# Patient Record
Sex: Female | Born: 1985 | Race: Black or African American | Hispanic: No | Marital: Single | Smoking: Former smoker
Health system: Southern US, Community
[De-identification: ages and names within clinical notes are randomized; demographics above are authoritative.]

## PROBLEM LIST (undated history)

## (undated) DIAGNOSIS — J40 Bronchitis, not specified as acute or chronic: Secondary | ICD-10-CM

## (undated) DIAGNOSIS — I10 Essential (primary) hypertension: Secondary | ICD-10-CM

## (undated) DIAGNOSIS — D649 Anemia, unspecified: Secondary | ICD-10-CM

## (undated) DIAGNOSIS — B20 Human immunodeficiency virus [HIV] disease: Secondary | ICD-10-CM

## (undated) DIAGNOSIS — E669 Obesity, unspecified: Secondary | ICD-10-CM

## (undated) HISTORY — PX: NO PAST SURGERIES: SHX2092

## (undated) HISTORY — DX: Essential (primary) hypertension: I10

---
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
## 2006-11-04 ENCOUNTER — Emergency Department (HOSPITAL_COMMUNITY): Admission: EM | Admit: 2006-11-04 | Discharge: 2006-11-04 | Payer: Self-pay | Admitting: Emergency Medicine

## 2006-12-09 ENCOUNTER — Emergency Department (HOSPITAL_COMMUNITY): Admission: EM | Admit: 2006-12-09 | Discharge: 2006-12-09 | Payer: Self-pay | Admitting: Emergency Medicine

## 2008-02-21 ENCOUNTER — Ambulatory Visit: Payer: Self-pay | Admitting: Gynecology

## 2008-02-21 ENCOUNTER — Inpatient Hospital Stay (HOSPITAL_COMMUNITY): Admission: AD | Admit: 2008-02-21 | Discharge: 2008-02-21 | Payer: Self-pay | Admitting: Gynecology

## 2008-02-22 ENCOUNTER — Ambulatory Visit: Payer: Self-pay | Admitting: Obstetrics and Gynecology

## 2008-02-22 ENCOUNTER — Inpatient Hospital Stay (HOSPITAL_COMMUNITY): Admission: AD | Admit: 2008-02-22 | Discharge: 2008-02-24 | Payer: Self-pay | Admitting: Obstetrics & Gynecology

## 2008-07-24 ENCOUNTER — Emergency Department (HOSPITAL_COMMUNITY): Admission: EM | Admit: 2008-07-24 | Discharge: 2008-07-24 | Payer: Self-pay | Admitting: Emergency Medicine

## 2008-07-26 ENCOUNTER — Emergency Department (HOSPITAL_COMMUNITY): Admission: EM | Admit: 2008-07-26 | Discharge: 2008-07-26 | Payer: Self-pay | Admitting: Emergency Medicine

## 2008-08-01 ENCOUNTER — Emergency Department (HOSPITAL_COMMUNITY): Admission: EM | Admit: 2008-08-01 | Discharge: 2008-08-01 | Payer: Self-pay | Admitting: Emergency Medicine

## 2008-10-23 ENCOUNTER — Emergency Department (HOSPITAL_COMMUNITY): Admission: EM | Admit: 2008-10-23 | Discharge: 2008-10-24 | Payer: Self-pay | Admitting: Emergency Medicine

## 2008-10-24 ENCOUNTER — Emergency Department (HOSPITAL_COMMUNITY): Admission: EM | Admit: 2008-10-24 | Discharge: 2008-10-24 | Payer: Self-pay | Admitting: Emergency Medicine

## 2010-01-10 ENCOUNTER — Emergency Department (HOSPITAL_COMMUNITY): Admission: EM | Admit: 2010-01-10 | Discharge: 2010-01-10 | Payer: Self-pay | Admitting: Emergency Medicine

## 2010-11-02 ENCOUNTER — Emergency Department (HOSPITAL_COMMUNITY)
Admission: EM | Admit: 2010-11-02 | Discharge: 2010-11-02 | Payer: Self-pay | Source: Home / Self Care | Admitting: Emergency Medicine

## 2010-12-14 ENCOUNTER — Emergency Department (HOSPITAL_COMMUNITY)
Admission: EM | Admit: 2010-12-14 | Discharge: 2010-12-14 | Payer: Self-pay | Source: Home / Self Care | Admitting: Emergency Medicine

## 2010-12-14 LAB — URINALYSIS, ROUTINE W REFLEX MICROSCOPIC
Bilirubin Urine: NEGATIVE
Ketones, ur: NEGATIVE mg/dL
Nitrite: NEGATIVE
Urobilinogen, UA: 0.2 mg/dL (ref 0.0–1.0)
pH: 6.5 (ref 5.0–8.0)

## 2010-12-14 LAB — POCT PREGNANCY, URINE: Preg Test, Ur: NEGATIVE

## 2010-12-14 LAB — WET PREP, GENITAL: Yeast Wet Prep HPF POC: NONE SEEN

## 2010-12-14 LAB — URINE MICROSCOPIC-ADD ON

## 2010-12-15 LAB — POCT PREGNANCY, URINE: Preg Test, Ur: NEGATIVE

## 2011-01-03 ENCOUNTER — Emergency Department (HOSPITAL_COMMUNITY)
Admission: EM | Admit: 2011-01-03 | Discharge: 2011-01-03 | Disposition: A | Discharge status: Home / Self Care | Payer: Medicaid Other | Attending: Emergency Medicine | Admitting: Emergency Medicine

## 2011-01-03 ENCOUNTER — Ambulatory Visit (HOSPITAL_COMMUNITY)
Admission: RE | Admit: 2011-01-03 | Discharge: 2011-01-03 | Disposition: A | Discharge status: Home / Self Care | Payer: Medicaid Other | Source: Ambulatory Visit | Attending: Emergency Medicine | Admitting: Emergency Medicine

## 2011-01-03 DIAGNOSIS — R300 Dysuria: Secondary | ICD-10-CM | POA: Insufficient documentation

## 2011-01-03 DIAGNOSIS — Z79899 Other long term (current) drug therapy: Secondary | ICD-10-CM | POA: Insufficient documentation

## 2011-01-03 DIAGNOSIS — Z21 Asymptomatic human immunodeficiency virus [HIV] infection status: Secondary | ICD-10-CM | POA: Insufficient documentation

## 2011-01-03 DIAGNOSIS — IMO0001 Reserved for inherently not codable concepts without codable children: Secondary | ICD-10-CM | POA: Insufficient documentation

## 2011-01-03 DIAGNOSIS — R05 Cough: Secondary | ICD-10-CM | POA: Insufficient documentation

## 2011-01-03 DIAGNOSIS — R509 Fever, unspecified: Secondary | ICD-10-CM | POA: Insufficient documentation

## 2011-01-03 DIAGNOSIS — J029 Acute pharyngitis, unspecified: Secondary | ICD-10-CM | POA: Insufficient documentation

## 2011-01-03 DIAGNOSIS — R070 Pain in throat: Secondary | ICD-10-CM | POA: Insufficient documentation

## 2011-01-03 DIAGNOSIS — F3130 Bipolar disorder, current episode depressed, mild or moderate severity, unspecified: Secondary | ICD-10-CM | POA: Insufficient documentation

## 2011-01-03 DIAGNOSIS — J40 Bronchitis, not specified as acute or chronic: Secondary | ICD-10-CM | POA: Insufficient documentation

## 2011-01-03 DIAGNOSIS — R059 Cough, unspecified: Secondary | ICD-10-CM | POA: Insufficient documentation

## 2011-01-03 LAB — DIFFERENTIAL
Basophils Relative: 0 % (ref 0–1)
Lymphocytes Relative: 13 % (ref 12–46)
Monocytes Absolute: 1 10*3/uL (ref 0.1–1.0)
Monocytes Relative: 7 % (ref 3–12)
Neutro Abs: 10.3 10*3/uL — ABNORMAL HIGH (ref 1.7–7.7)
Neutrophils Relative %: 78 % — ABNORMAL HIGH (ref 43–77)

## 2011-01-03 LAB — LACTIC ACID, PLASMA: Lactic Acid, Venous: 1.5 mmol/L (ref 0.5–2.2)

## 2011-01-03 LAB — CBC
HCT: 31.6 % — ABNORMAL LOW (ref 36.0–46.0)
Hemoglobin: 10.3 g/dL — ABNORMAL LOW (ref 12.0–15.0)
MCH: 25.2 pg — ABNORMAL LOW (ref 26.0–34.0)
RBC: 4.08 MIL/uL (ref 3.87–5.11)

## 2011-01-03 LAB — COMPREHENSIVE METABOLIC PANEL
ALT: 28 U/L (ref 0–35)
AST: 29 U/L (ref 0–37)
CO2: 24 mEq/L (ref 19–32)
Chloride: 106 mEq/L (ref 96–112)
Creatinine, Ser: 0.82 mg/dL (ref 0.4–1.2)
GFR calc Af Amer: >60 mL/min (ref >60)
GFR calc non Af Amer: >60 mL/min (ref >60)
Glucose, Bld: 110 mg/dL — ABNORMAL HIGH (ref 70–99)
Sodium: 138 mEq/L (ref 135–145)
Total Bilirubin: 0.3 mg/dL (ref 0.3–1.2)

## 2011-01-03 LAB — URINALYSIS, ROUTINE W REFLEX MICROSCOPIC
Bilirubin Urine: NEGATIVE
Hgb urine dipstick: NEGATIVE
Nitrite: NEGATIVE
Specific Gravity, Urine: 1.025 (ref 1.005–1.030)
Urine Glucose, Fasting: NEGATIVE mg/dL
pH: 7 (ref 5.0–8.0)

## 2011-01-03 LAB — PROCALCITONIN: Procalcitonin: <0.1 ng/mL

## 2011-01-05 LAB — URINE CULTURE

## 2011-01-08 LAB — CULTURE, BLOOD (ROUTINE X 2): Culture: NO GROWTH

## 2011-04-03 NOTE — Procedures (Signed)
Theresa Becker, Theresa Becker                ACCOUNT NO.:  [id number]   MEDICAL RECORD NO.:  [id number]          PATIENT TYPE:  EMS   LOCATION:  ED                            FACILITY:  [facility]   PHYSICIAN:  Edward L. Juanetta Gosling, M.D.DATE OF BIRTH:  04/20/1986   DATE OF PROCEDURE:  12/20/2005  DATE OF DISCHARGE:  12/20/2005                                EKG INTERPRETATION   The rhythm appears to be a ventricular bigeminy with a rate of about 70.  Abnormal electrocardiogram.      Oneal Deputy. Juanetta Gosling, M.D.  Electronically Signed     [facility]/MEDQ  D:  12/25/2005  T:  12/25/2005  Job:  [id number]

## 2011-08-11 LAB — CBC
HCT: 33.2 — ABNORMAL LOW
Hemoglobin: 11.3 — ABNORMAL LOW
MCHC: 33.9
MCV: 79.5
Platelets: 147 — ABNORMAL LOW
Platelets: 165
RBC: 4.18
RDW: 16.4 — ABNORMAL HIGH
RDW: 17.1 — ABNORMAL HIGH

## 2011-08-11 LAB — URIC ACID: Uric Acid, Serum: 3.4

## 2011-08-11 LAB — COMPREHENSIVE METABOLIC PANEL
ALT: 21
Albumin: 2.6 — ABNORMAL LOW
BUN: 8
CO2: 24
Calcium: 9.2
Creatinine, Ser: 0.47
GFR calc non Af Amer: >60
Glucose, Bld: 82

## 2011-08-11 LAB — LACTATE DEHYDROGENASE: LDH: 176

## 2011-08-11 LAB — URINALYSIS, DIPSTICK ONLY
Ketones, ur: NEGATIVE
Leukocytes, UA: NEGATIVE
Nitrite: NEGATIVE
Protein, ur: NEGATIVE
Urobilinogen, UA: 0.2

## 2011-08-19 LAB — CBC
HCT: 36.6
MCHC: 33.1
MCV: 78.4
Platelets: 154
WBC: 8

## 2011-08-19 LAB — COMPREHENSIVE METABOLIC PANEL
Albumin: 3.4 — ABNORMAL LOW
BUN: 17
CO2: 26
Chloride: 106
Creatinine, Ser: 0.93

## 2011-08-19 LAB — DIFFERENTIAL
Basophils Relative: 0
Eosinophils Absolute: 0.1
Eosinophils Relative: 2
Lymphs Abs: 1.7
Monocytes Absolute: 0.7
Monocytes Relative: 9
Neutrophils Relative %: 69

## 2011-08-19 LAB — URINALYSIS, ROUTINE W REFLEX MICROSCOPIC
Nitrite: NEGATIVE
Protein, ur: NEGATIVE
Urobilinogen, UA: 0.2

## 2011-08-19 LAB — PREGNANCY, URINE: Preg Test, Ur: NEGATIVE

## 2011-08-19 LAB — URINE MICROSCOPIC-ADD ON

## 2012-01-02 ENCOUNTER — Emergency Department (HOSPITAL_COMMUNITY): Payer: Medicaid Other

## 2012-01-02 ENCOUNTER — Encounter (HOSPITAL_COMMUNITY): Payer: Self-pay | Admitting: *Deleted

## 2012-01-02 ENCOUNTER — Emergency Department (HOSPITAL_COMMUNITY)
Admission: EM | Admit: 2012-01-02 | Discharge: 2012-01-02 | Disposition: A | Discharge status: Home / Self Care | Payer: Medicaid Other | Attending: Emergency Medicine | Admitting: Emergency Medicine

## 2012-01-02 DIAGNOSIS — R079 Chest pain, unspecified: Secondary | ICD-10-CM | POA: Insufficient documentation

## 2012-01-02 DIAGNOSIS — M549 Dorsalgia, unspecified: Secondary | ICD-10-CM | POA: Insufficient documentation

## 2012-01-02 DIAGNOSIS — R0789 Other chest pain: Secondary | ICD-10-CM

## 2012-01-02 MED ORDER — NAPROXEN 500 MG PO TABS: 500.0000 mg | ORAL_TABLET | Freq: Two times a day (BID) | ORAL | Status: DC

## 2012-01-02 NOTE — ED Notes (Signed)
Pt a/ox4. Resp even and unlabored. NAD at this time. D/C instructions reviewed with pt. Pt verbalized understanding. Pt ambulated to lobby with steady gate.  

## 2012-01-02 NOTE — Discharge Instructions (Signed)
No sniffing and amount is found on chest x-ray. Continue to followup with your chiropractor for the upper back problems. Return for new or worse symptoms. Trial of anti-inflammatory medicine for 7 days to see if it helps.

## 2012-01-02 NOTE — ED Provider Notes (Signed)
History   This chart was scribed for Shelda Jakes, MD by Charolett Bumpers . The patient was seen in room APFT23/APFT23 and the patient's care was started at 7:09pm.   CSN: [id number]  Arrival date & time 01/02/12  Rickey Primus   First MD Initiated Contact with Patient 01/02/12 1849      Chief Complaint  Patient presents with  . Back Pain    (Consider location/radiation/quality/duration/timing/severity/associated sxs/prior treatment) HPI Peace Noyes Teagarden is a 26 y.o. female who presents to the Emergency Department complaining of constant, moderate upper back pain in the mid thoracic area with associated swelling on her breast bone. Patient describes the breast bone swelling as a "knot". Patient states that the upper back pain has been constant for the past month. Patient states that "knot" on her breast bone has been there for the past 3 days. Patient notes that she has been experiencing back pain for the past year, and has been seen for the back pain. Per patient's report, she states that she was told she was suffering from muscle spasms. No other symptoms reported. No pertinent medical hx reported.    History reviewed. No pertinent past medical history.  History reviewed. No pertinent past surgical history.  History reviewed. No pertinent family history.  History  Substance Use Topics  . Smoking status: Former Games developer  . Smokeless tobacco: Not on file  . Alcohol Use: No    OB History    Grav Para Term Preterm Abortions TAB SAB Ect Mult Living                  Review of Systems A complete 10 system review of systems was obtained and is otherwise negative except as noted in the HPI and PMH.   Allergies  Penicillins  Home Medications   Current Outpatient Rx  Name Route Sig Dispense Refill  . NAPROXEN 500 MG PO TABS Oral Take 1 tablet (500 mg total) by mouth 2 (two) times daily. 14 tablet 0    BP 153/87  Pulse 96  Temp(Src) 98.6 F (37 C) (Oral)  Resp 18  Ht 5'  2" (1.575 m)  Wt 273 lb (123.832 kg)  BMI 49.93 kg/m2  SpO2 100%  LMP 12/18/2011  Physical Exam  Nursing note and vitals reviewed. Constitutional: She is oriented to person, place, and time. She appears well-developed and well-nourished. No distress.  HENT:  Head: Normocephalic and atraumatic.  Eyes: EOM are normal. Pupils are equal, round, and reactive to light.  Neck: Normal range of motion. Neck supple. No tracheal deviation present.  Cardiovascular: Normal rate, regular rhythm and normal heart sounds.  Exam reveals no gallop and no friction rub.   No murmur heard. Pulmonary/Chest: Effort normal and breath sounds normal. No respiratory distress. She has no wheezes. She has no rales. She exhibits no tenderness.  Abdominal: Soft. Bowel sounds are normal. She exhibits no distension. There is no tenderness.  Musculoskeletal: Normal range of motion. She exhibits no edema.       No midline cervical, thoracic or lumbar tenderness.   Neurological: She is alert and oriented to person, place, and time. No cranial nerve deficit or sensory deficit. Coordination normal.  Skin: Skin is warm and dry.  Psychiatric: She has a normal mood and affect. Her behavior is normal.    ED Course  Procedures (including critical care time)  DIAGNOSTIC STUDIES: Oxygen Saturation is 100% on room air, normal by my interpretation.    COORDINATION OF CARE:  Labs Reviewed - No data to display Dg Chest 2 View  01/02/2012  *RADIOLOGY REPORT*  Clinical Data: Right-sided chest pain for 3 days.  Remote fall.  CHEST - 2 VIEW  Comparison: 01/03/2011.  Findings:  No obvious thoracic spine fracture.  If this is of concern, additional imaging may be considered.  No infiltrate, congestive heart failure or pneumothorax.  Heart size within normal limits.  IMPRESSION: No acute abnormality.  Please see above.  Original Report Authenticated By: Fuller Canada, M.D.     1. Back pain   2. Sternal pain       MDM    Chest x-ray negative for a specific findings. Clinically suspicious just mild tenderness at the distal end of the sternum. A skin cyst present no erythema no palpable abnormality.   I personally performed the services described in this documentation, which was scribed in my presence. The recorded information has been reviewed and considered.        Shelda Jakes, MD 01/02/12 [phone]

## 2012-01-02 NOTE — ED Notes (Signed)
Pt states that she is having upper back pain but feels like she has a knot on her breast bone.

## 2012-01-10 ENCOUNTER — Emergency Department (HOSPITAL_COMMUNITY)
Admission: EM | Admit: 2012-01-10 | Discharge: 2012-01-10 | Disposition: A | Discharge status: Home / Self Care | Payer: Medicaid Other | Attending: Emergency Medicine | Admitting: Emergency Medicine

## 2012-01-10 ENCOUNTER — Encounter (HOSPITAL_COMMUNITY): Payer: Self-pay

## 2012-01-10 DIAGNOSIS — J019 Acute sinusitis, unspecified: Secondary | ICD-10-CM | POA: Insufficient documentation

## 2012-01-10 DIAGNOSIS — R05 Cough: Secondary | ICD-10-CM | POA: Insufficient documentation

## 2012-01-10 DIAGNOSIS — R059 Cough, unspecified: Secondary | ICD-10-CM | POA: Insufficient documentation

## 2012-01-10 HISTORY — DX: Human immunodeficiency virus (HIV) disease: B20

## 2012-01-10 MED ORDER — AZITHROMYCIN 250 MG PO TABS: 250.0000 mg | ORAL_TABLET | Freq: Every day | ORAL | Status: AC

## 2012-01-10 NOTE — Discharge Instructions (Signed)

## 2012-01-10 NOTE — ED Provider Notes (Signed)
History     CSN: [id number]  Arrival date & time 01/10/12  0902   First MD Initiated Contact with Patient 01/10/12 [phone]      Chief Complaint  Patient presents with  . URI    (Consider location/radiation/quality/duration/timing/severity/associated sxs/prior treatment) Patient is a 26 y.o. female presenting with URI. The history is provided by the patient.  URI The primary symptoms include cough. Primary symptoms do not include fever, headaches, sore throat, swollen glands, abdominal pain, nausea, arthralgias or rash. Primary symptoms comment: nasal congestion with purulent nasal discharge, blood-tinged.  Low-grade fever. The current episode started 3 to 5 days ago. This is a new problem. The problem has not changed since onset. The cough began 2 days ago. The cough is new. The cough is non-productive.  Symptoms associated with the illness include sinus pressure, congestion and rhinorrhea.    Past Medical History  Diagnosis Date  . HIV disease     History reviewed. No pertinent past surgical history.  No family history on file.  History  Substance Use Topics  . Smoking status: Former Games developer  . Smokeless tobacco: Not on file  . Alcohol Use: No    OB History    Grav Para Term Preterm Abortions TAB SAB Ect Mult Living                  Review of Systems  Constitutional: Negative for fever.  HENT: Positive for congestion, rhinorrhea and sinus pressure. Negative for sore throat and neck pain.   Eyes: Negative.   Respiratory: Positive for cough. Negative for chest tightness and shortness of breath.   Cardiovascular: Negative for chest pain.  Gastrointestinal: Negative for nausea and abdominal pain.  Genitourinary: Negative.   Musculoskeletal: Negative for joint swelling and arthralgias.  Skin: Negative.  Negative for rash and wound.  Neurological: Negative for dizziness, weakness, light-headedness, numbness and headaches.  Hematological: Negative.     Psychiatric/Behavioral: Negative.     Allergies  Penicillins  Home Medications   Current Outpatient Rx  Name Route Sig Dispense Refill  . NAPROXEN 500 MG PO TABS Oral Take 1 tablet (500 mg total) by mouth 2 (two) times daily. 14 tablet 0    BP 128/96  Pulse 93  Temp(Src) 97.9 F (36.6 C) (Oral)  Resp 18  SpO2 100%  LMP 01/07/2012  Physical Exam  Nursing note and vitals reviewed. Constitutional: She is oriented to person, place, and time. She appears well-developed and well-nourished.  HENT:  Head: Normocephalic and atraumatic.  Right Ear: Tympanic membrane and ear canal normal.  Left Ear: Tympanic membrane and ear canal normal.  Nose: Mucosal edema and rhinorrhea present. Right sinus exhibits frontal sinus tenderness. Left sinus exhibits frontal sinus tenderness.  Eyes: Conjunctivae are normal.  Neck: Normal range of motion.  Cardiovascular: Normal rate, regular rhythm, normal heart sounds and intact distal pulses.   Pulmonary/Chest: Effort normal and breath sounds normal. No respiratory distress. She has no wheezes. She has no rales.  Abdominal: Soft. Bowel sounds are normal. There is no tenderness.  Musculoskeletal: Normal range of motion.  Neurological: She is alert and oriented to person, place, and time.  Skin: Skin is warm and dry.  Psychiatric: She has a normal mood and affect.    ED Course  Procedures (including critical care time)  Labs Reviewed - No data to display No results found.   1. Sinusitis acute       MDM  Zithromax.  Patient to continue her Mucinex.  Fluids,  recheck by PCP this week if not improved.   Patient states his last CD4 count was 600, viral load less than 40, was checked less than one month ago.     Candis Musa, PA 01/10/12 1011  Candis Musa, PA 01/10/12 1013

## 2012-01-10 NOTE — ED Provider Notes (Signed)
Medical screening examination/treatment/procedure(s) were performed by non-physician practitioner and as supervising physician I was immediately available for consultation/collaboration.   Geoffery Lyons, MD 01/10/12 1113

## 2012-01-10 NOTE — ED Notes (Signed)
Pt reports stopped up nose, nasal drainage, and nonproductive cough x 4 days.  Reports low grade fever last night.

## 2012-04-25 ENCOUNTER — Encounter (HOSPITAL_COMMUNITY): Payer: Self-pay | Admitting: *Deleted

## 2012-04-25 ENCOUNTER — Emergency Department (HOSPITAL_COMMUNITY)
Admission: EM | Admit: 2012-04-25 | Discharge: 2012-04-25 | Disposition: A | Discharge status: Home / Self Care | Payer: Medicaid Other | Attending: Emergency Medicine | Admitting: Emergency Medicine

## 2012-04-25 DIAGNOSIS — S90569A Insect bite (nonvenomous), unspecified ankle, initial encounter: Secondary | ICD-10-CM | POA: Insufficient documentation

## 2012-04-25 DIAGNOSIS — Z87891 Personal history of nicotine dependence: Secondary | ICD-10-CM | POA: Insufficient documentation

## 2012-04-25 DIAGNOSIS — Z21 Asymptomatic human immunodeficiency virus [HIV] infection status: Secondary | ICD-10-CM | POA: Insufficient documentation

## 2012-04-25 DIAGNOSIS — W57XXXA Bitten or stung by nonvenomous insect and other nonvenomous arthropods, initial encounter: Secondary | ICD-10-CM

## 2012-04-25 MED ORDER — DIPHENHYDRAMINE HCL 25 MG PO CAPS
25.0000 mg | ORAL_CAPSULE | Freq: Once | ORAL | Status: AC
  Administered 2012-04-25: 25 mg via ORAL
  Filled 2012-04-25: qty 1

## 2012-04-25 MED ORDER — DIPHENHYDRAMINE HCL 25 MG PO CAPS: 25.0000 mg | ORAL_CAPSULE | Freq: Four times a day (QID) | ORAL | Status: DC | PRN

## 2012-04-25 NOTE — ED Provider Notes (Addendum)
History     CSN: [id number]  Arrival date & time 04/25/12  0151   None     Chief Complaint  Patient presents with  . Insect Bite    (Consider location/radiation/quality/duration/timing/severity/associated sxs/prior treatment) HPI Comments: Patient was lying on the couch of the hand on the left thigh.  When she felt something bite.  She tested.  She now has a small, red area on her anterior left thigh, as well, as an irritated area on the medial aspect of her left long finger.  She says there is a burning sensation at the, "right thigh."  She is also feeling funny and having a little blurry vision.  She did not see what bit her.  She did not take any Tylenol or ibuprofen, Benadryl prior to arrival.  She brushed immediately to the emergency department concerned that maybe a "spider bite"  The history is provided by the patient.    Past Medical History  Diagnosis Date  . HIV disease     History reviewed. No pertinent past surgical history.  No family history on file.  History  Substance Use Topics  . Smoking status: Former Games developer  . Smokeless tobacco: Not on file  . Alcohol Use: No    OB History    Grav Para Term Preterm Abortions TAB SAB Ect Mult Living                  Review of Systems  Constitutional: Negative for fever and chills.  Eyes: Positive for visual disturbance.  Respiratory: Negative for shortness of breath.   Gastrointestinal: Negative for abdominal pain.  Skin: Negative for rash and wound.  Neurological: Negative for dizziness and headaches.    Allergies  Penicillins  Home Medications   Current Outpatient Rx  Name Route Sig Dispense Refill  . CETIRIZINE HCL 10 MG PO TABS Oral Take 10 mg by mouth daily as needed. For allergies      BP 131/78  Pulse 84  Temp(Src) 97.9 F (36.6 C) (Oral)  Resp 16  SpO2 100%  Physical Exam  Constitutional: She appears well-developed and well-nourished.       Morbidly obese  Eyes: Pupils are equal, round,  and reactive to light.  Neck: Normal range of motion.  Cardiovascular: Normal rate.   Musculoskeletal: She exhibits no edema and no tenderness.  Neurological: She is alert.  Skin: Skin is warm. No rash noted. No pallor.       Faint pink area on her anterior thigh, approximately 0.5 cm x 0.25 cm    ED Course  Procedures (including critical care time)  Labs Reviewed - No data to display No results found.   1. Insect bite     Area outlined patient counseled and will return in 48 hours for a recheck or sooner if she develope central necrosis red streaking fever   MDM   Due to patient's concern of an allergic reaction.  I have treated her with Benadryl and watch her.  She is not tachycardic.  She is having no respiratory distress        Arman Filter, NP 04/25/12 0220  Arman Filter, NP 04/25/12 [phone]

## 2012-04-25 NOTE — ED Provider Notes (Signed)
Medical screening examination/treatment/procedure(s) were performed by non-physician practitioner and as supervising physician I was immediately available for consultation/collaboration.  Jasson Siegmann K Tanazia Achee-Rasch, MD 04/25/12 0614 

## 2012-04-25 NOTE — ED Provider Notes (Signed)
Medical screening examination/treatment/procedure(s) were performed by non-physician practitioner and as supervising physician I was immediately available for consultation/collaboration.  Jasmine Awe, MD 04/25/12 [phone]

## 2012-04-25 NOTE — Discharge Instructions (Signed)
Insect Bite Mosquitoes, flies, fleas, bedbugs, and many other insects can bite. Insect bites are different from insect stings. A sting is when venom is injected into the skin. Some insect bites can transmit infectious diseases. SYMPTOMS  Insect bites usually turn red, swell, and itch for 2 to 4 days. They often go away on their own. TREATMENT  Your caregiver may prescribe antibiotic medicines if a bacterial infection develops in the bite. HOME CARE INSTRUCTIONS  Do not scratch the bite area.   Keep the bite area clean and dry. Wash the bite area thoroughly with soap and water.   Put ice or cool compresses on the bite area.   Put ice in a plastic bag.   Place a towel between your skin and the bag.   Leave the ice on for 20 minutes, 4 times a day for the first 2 to 3 days, or as directed.   You may apply a baking soda paste, cortisone cream, or calamine lotion to the bite area as directed by your caregiver. This can help reduce itching and swelling.   Only take over-the-counter or prescription medicines as directed by your caregiver.   If you are given antibiotics, take them as directed. Finish them even if you start to feel better.  You may need a tetanus shot if:  You cannot remember when you had your last tetanus shot.   You have never had a tetanus shot.   The injury broke your skin.  If you get a tetanus shot, your arm may swell, get red, and feel warm to the touch. This is common and not a problem. If you need a tetanus shot and you choose not to have one, there is a rare chance of getting tetanus. Sickness from tetanus can be serious. SEEK IMMEDIATE MEDICAL CARE IF:   You have increased pain, redness, or swelling in the bite area.   You see a red line on the skin coming from the bite.   You have a fever.   You have joint pain.   You have a headache or neck pain.   You have unusual weakness.   You have a rash.   You have chest pain or shortness of breath.   You  have abdominal pain, nausea, or vomiting.   You feel unusually tired or sleepy.  MAKE SURE YOU:   Understand these instructions.   Will watch your condition.   Will get help right away if you are not doing well or get worse.  Document Released: 12/10/2004 Document Revised: 10/22/2011 Document Reviewed: 06/03/2011 [facility] Patient Information 2012 [place], [place]. The bite has been marked if the redness goes out of the line, you develop a central brown/black spot,  red streaking, fever return sooner for further evaluation  Take Benadryl every 6 hours until reevaluated

## 2012-04-25 NOTE — ED Notes (Signed)
The pt thinks she was bitten on her lt thigh while lying on the  [place] 5 minutes ago

## 2012-05-24 ENCOUNTER — Emergency Department (HOSPITAL_COMMUNITY)
Admission: EM | Admit: 2012-05-24 | Discharge: 2012-05-24 | Disposition: A | Discharge status: Home / Self Care | Payer: Medicaid Other | Attending: Emergency Medicine | Admitting: Emergency Medicine

## 2012-05-24 ENCOUNTER — Encounter (HOSPITAL_COMMUNITY): Payer: Self-pay | Admitting: *Deleted

## 2012-05-24 DIAGNOSIS — Z880 Allergy status to penicillin: Secondary | ICD-10-CM | POA: Insufficient documentation

## 2012-05-24 DIAGNOSIS — IMO0001 Reserved for inherently not codable concepts without codable children: Secondary | ICD-10-CM | POA: Insufficient documentation

## 2012-05-24 DIAGNOSIS — Z87891 Personal history of nicotine dependence: Secondary | ICD-10-CM | POA: Insufficient documentation

## 2012-05-24 DIAGNOSIS — W57XXXA Bitten or stung by nonvenomous insect and other nonvenomous arthropods, initial encounter: Secondary | ICD-10-CM

## 2012-05-24 DIAGNOSIS — Z21 Asymptomatic human immunodeficiency virus [HIV] infection status: Secondary | ICD-10-CM | POA: Insufficient documentation

## 2012-05-24 MED ORDER — IBUPROFEN 800 MG PO TABS: 800.0000 mg | ORAL_TABLET | Freq: Three times a day (TID) | ORAL | Status: AC

## 2012-05-24 MED ORDER — DOXYCYCLINE HYCLATE 100 MG PO CAPS: 100.0000 mg | ORAL_CAPSULE | Freq: Two times a day (BID) | ORAL | Status: AC

## 2012-05-24 MED ORDER — TETANUS-DIPHTH-ACELL PERTUSSIS 5-2.5-18.5 LF-MCG/0.5 IM SUSP
0.5000 mL | Freq: Once | INTRAMUSCULAR | Status: AC
  Administered 2012-05-24: 0.5 mL via INTRAMUSCULAR
  Filled 2012-05-24: qty 0.5

## 2012-05-24 NOTE — ED Notes (Signed)
Pt report being bit by unknown insect about 15-20 minutes ago.  Reports right elbow swollen and burning.  No distress noted at present.

## 2012-05-24 NOTE — ED Provider Notes (Signed)
History     CSN: [id number]  Arrival date & time 05/24/12  0257   First MD Initiated Contact with Patient 05/24/12 [phone]      Chief Complaint  Patient presents with  . Insect Bite    (Consider location/radiation/quality/duration/timing/severity/associated sxs/prior treatment) HPI History provided by patient. Like she got stung by an insect about 15-20 minutes prior to arrival. Location right arm near her elbow. She has some redness and stinging still present. Pain radiates to her right shoulder. No weakness or numbness. No bleeding. No swelling. Insect not identified. Mild in severity.no aggravating or alleviating factors. Has not taken any medications for this. Past Medical History  Diagnosis Date  . HIV disease     History reviewed. No pertinent past surgical history.  History reviewed. No pertinent family history.  History  Substance Use Topics  . Smoking status: Former Games developer  . Smokeless tobacco: Not on file  . Alcohol Use: No    OB History    Grav Para Term Preterm Abortions TAB SAB Ect Mult Living                  Review of Systems  Constitutional: Negative for fever and chills.  HENT: Negative for neck pain and neck stiffness.   Eyes: Negative for pain.  Respiratory: Negative for shortness of breath.   Cardiovascular: Negative for chest pain.  Gastrointestinal: Negative for abdominal pain.  Genitourinary: Negative for dysuria.  Musculoskeletal: Negative for back pain.  Skin: Negative for rash and wound.  Neurological: Negative for headaches.  All other systems reviewed and are negative.    Allergies  Penicillins  Home Medications   Current Outpatient Rx  Name Route Sig Dispense Refill  . EMTRICITABINE-TENOFOVIR 200-300 MG PO TABS Oral Take 1 tablet by mouth daily.    Marland Kitchen ETRAVIRINE 200 MG PO TABS Oral Take 200 mg by mouth.    . CETIRIZINE HCL 10 MG PO TABS Oral Take 10 mg by mouth daily as needed. For allergies    . DIPHENHYDRAMINE HCL 25 MG PO CAPS  Oral Take 1 capsule (25 mg total) by mouth every 6 (six) hours as needed for itching. 30 capsule 0    BP 144/92  Pulse 70  Temp 99 F (37.2 C) (Oral)  Resp 16  Ht 5\' 2"  (1.575 m)  Wt 276 lb (125.193 kg)  BMI 50.48 kg/m2  SpO2 99%  LMP 05/21/2012  Physical Exam  Constitutional: She is oriented to person, place, and time. She appears well-developed and well-nourished.  HENT:  Head: Normocephalic and atraumatic.  Eyes: Conjunctivae and EOM are normal. Pupils are equal, round, and reactive to light.  Neck: Trachea normal. Neck supple. No thyromegaly present.  Cardiovascular: Normal rate, regular rhythm, S1 normal, S2 normal and normal pulses.     No systolic murmur is present   No diastolic murmur is present  Pulses:      Radial pulses are 2+ on the right side, and 2+ on the left side.  Pulmonary/Chest: Effort normal and breath sounds normal. She has no wheezes. She has no rhonchi. She has no rales. She exhibits no tenderness.  Abdominal: Soft. Normal appearance and bowel sounds are normal. There is no tenderness. There is no CVA tenderness and negative Murphy's sign.  Musculoskeletal:       Right upper extremity with area of erythema and mild tenderness over lateral aspect of the elbow. Mild increased warmth to touch. No fluctuance. No stinger or insect parts identified. Distal neurovascular intact.  Neurological: She is alert and oriented to person, place, and time. She has normal strength. No cranial nerve deficit or sensory deficit. GCS eye subscore is 4. GCS verbal subscore is 5. GCS motor subscore is 6.  Skin: Skin is warm and dry. She is not diaphoretic.  Psychiatric: Her speech is normal.       Cooperative and appropriate    ED Course  Procedures (including critical care time)   Infection precautions verbalized is understood. Tetanus updated.  MDM   Acute insect bite right arm  Pain medications and antibiotics provided. Patient resource guide provided for primary  care physician follow up as needed. No indication for workup or admission at this time.      Sunnie Nielsen, MD 05/24/12 [phone]

## 2013-12-05 ENCOUNTER — Emergency Department (HOSPITAL_COMMUNITY): Payer: Medicaid Other

## 2013-12-05 ENCOUNTER — Encounter (HOSPITAL_COMMUNITY): Payer: Self-pay | Admitting: Emergency Medicine

## 2013-12-05 ENCOUNTER — Emergency Department (HOSPITAL_COMMUNITY)
Admission: EM | Admit: 2013-12-05 | Discharge: 2013-12-06 | Disposition: A | Discharge status: Home / Self Care | Payer: Medicaid Other | Attending: Emergency Medicine | Admitting: Emergency Medicine

## 2013-12-05 DIAGNOSIS — B9789 Other viral agents as the cause of diseases classified elsewhere: Secondary | ICD-10-CM

## 2013-12-05 DIAGNOSIS — Z880 Allergy status to penicillin: Secondary | ICD-10-CM | POA: Insufficient documentation

## 2013-12-05 DIAGNOSIS — Z21 Asymptomatic human immunodeficiency virus [HIV] infection status: Secondary | ICD-10-CM | POA: Insufficient documentation

## 2013-12-05 DIAGNOSIS — Z87891 Personal history of nicotine dependence: Secondary | ICD-10-CM | POA: Insufficient documentation

## 2013-12-05 DIAGNOSIS — J069 Acute upper respiratory infection, unspecified: Secondary | ICD-10-CM

## 2013-12-05 DIAGNOSIS — E669 Obesity, unspecified: Secondary | ICD-10-CM | POA: Insufficient documentation

## 2013-12-05 DIAGNOSIS — Z79899 Other long term (current) drug therapy: Secondary | ICD-10-CM | POA: Insufficient documentation

## 2013-12-05 HISTORY — DX: Obesity, unspecified: E66.9

## 2013-12-05 LAB — CBC WITH DIFFERENTIAL/PLATELET
BASOS PCT: 0 % (ref 0–1)
Basophils Absolute: 0 10*3/uL (ref 0.0–0.1)
EOS PCT: 3 % (ref 0–5)
Eosinophils Absolute: 0.2 10*3/uL (ref 0.0–0.7)
HEMATOCRIT: 36 % (ref 36.0–46.0)
HEMOGLOBIN: 11.9 g/dL — AB (ref 12.0–15.0)
Lymphocytes Relative: 36 % (ref 12–46)
Lymphs Abs: 2.7 10*3/uL (ref 0.7–4.0)
MCH: 26.7 pg (ref 26.0–34.0)
MCHC: 33.1 g/dL (ref 30.0–36.0)
MCV: 80.7 fL (ref 78.0–100.0)
MONO ABS: 0.7 10*3/uL (ref 0.1–1.0)
Monocytes Relative: 9 % (ref 3–12)
NEUTROS ABS: 3.9 10*3/uL (ref 1.7–7.7)
NEUTROS PCT: 52 % (ref 43–77)
Platelets: 256 10*3/uL (ref 150–400)
RBC: 4.46 MIL/uL (ref 3.87–5.11)
RDW: 14.5 % (ref 11.5–15.5)
WBC: 7.5 10*3/uL (ref 4.0–10.5)

## 2013-12-05 LAB — BASIC METABOLIC PANEL
BUN: 12 mg/dL (ref 6–23)
CHLORIDE: 105 meq/L (ref 96–112)
CO2: 26 meq/L (ref 19–32)
Calcium: 8.9 mg/dL (ref 8.4–10.5)
Creatinine, Ser: 0.75 mg/dL (ref 0.50–1.10)
GFR calc Af Amer: >90 mL/min (ref >90)
GFR calc non Af Amer: >90 mL/min (ref >90)
Glucose, Bld: 85 mg/dL (ref 70–99)
Potassium: 4.1 mEq/L (ref 3.7–5.3)
SODIUM: 142 meq/L (ref 137–147)

## 2013-12-05 LAB — PRO B NATRIURETIC PEPTIDE: PRO B NATRI PEPTIDE: 49.2 pg/mL (ref 0–125)

## 2013-12-05 LAB — POCT I-STAT TROPONIN I: TROPONIN I, POC: 0 ng/mL (ref 0.00–0.08)

## 2013-12-05 NOTE — ED Notes (Signed)
Pt reports having bronchitis. Having upper chest tightness and upper back tightness. Reports productive cough that started yesterday. Airway intact.

## 2013-12-06 MED ORDER — ALBUTEROL SULFATE HFA 108 (90 BASE) MCG/ACT IN AERS
2.0000 | INHALATION_SPRAY | Freq: Once | RESPIRATORY_TRACT | Status: AC
  Administered 2013-12-06: 2 via RESPIRATORY_TRACT
  Filled 2013-12-06: qty 6.7

## 2013-12-06 MED ORDER — FLUTICASONE PROPIONATE 50 MCG/ACT NA SUSP: 2.0000 | Freq: Every day | NASAL | Status: DC

## 2013-12-06 MED ORDER — HYDROCODONE-HOMATROPINE 5-1.5 MG/5ML PO SYRP: 5.0000 mL | ORAL_SOLUTION | Freq: Four times a day (QID) | ORAL | Status: DC | PRN

## 2013-12-06 NOTE — ED Provider Notes (Signed)
CSN: [id number]     Arrival date & time 12/05/13  1629 History   First MD Initiated Contact with Patient 12/06/13 0010     Chief Complaint  Patient presents with  . Shortness of Breath  . Cough   (Consider location/radiation/quality/duration/timing/severity/associated sxs/prior Treatment) HPI Comments: Patient is a 28 year old female with a history of HIV and obesity who presents today for cough and chest congestion. Patient states that symptoms have been gradually worsening since one week ago. Cough is productive of yellow sputum. She states that symptoms are worsened when she is around cigarette smoke at work. She states that she gets mild relief from Robitussin and albuterol inhaler. Symptoms further associated with subjective fever, nasal congestion and rhinorrhea, and sore throat which has resolved. Patient states her last CD4 count was over 700.  Patient is a 28 y.o. female presenting with shortness of breath and cough. The history is provided by the patient. No language interpreter was used.  Shortness of Breath Associated symptoms: cough, fever and sore throat (Resolved)   Associated symptoms: no chest pain and no vomiting   Cough Associated symptoms: fever, rhinorrhea, shortness of breath and sore throat (Resolved)   Associated symptoms: no chest pain     Past Medical History  Diagnosis Date  . HIV disease   . Obesity    History reviewed. No pertinent past surgical history. History reviewed. No pertinent family history. History  Substance Use Topics  . Smoking status: Former Games developer  . Smokeless tobacco: Not on file  . Alcohol Use: No   OB History   Grav Para Term Preterm Abortions TAB SAB Ect Mult Living                 Review of Systems  Constitutional: Positive for fever.  HENT: Positive for congestion, postnasal drip, rhinorrhea, sinus pressure and sore throat (Resolved). Negative for drooling and trouble swallowing.   Eyes: Negative for visual disturbance.   Respiratory: Positive for cough, chest tightness and shortness of breath.   Cardiovascular: Negative for chest pain.  Gastrointestinal: Negative for vomiting and diarrhea.  Neurological: Negative for weakness and numbness.  All other systems reviewed and are negative.    Allergies  Penicillins  Home Medications   Current Outpatient Rx  Name  Route  Sig  Dispense  Refill  . albuterol (PROVENTIL HFA;VENTOLIN HFA) 108 (90 BASE) MCG/ACT inhaler   Inhalation   Inhale 1-2 puffs into the lungs every 6 (six) hours as needed for wheezing or shortness of breath.         . diphenhydrAMINE (BENADRYL) 25 mg capsule   Oral   Take 25 mg by mouth every 6 (six) hours as needed for allergies.         Marland Kitchen emtricitabine-tenofovir (TRUVADA) 200-300 MG per tablet   Oral   Take 1 tablet by mouth daily.         . Etravirine (INTELENCE) 200 MG TABS   Oral   Take 200 mg by mouth daily.          Marland Kitchen guaiFENesin (MUCINEX) 600 MG 12 hr tablet   Oral   Take 1,200 mg by mouth 2 (two) times daily as needed for cough or to loosen phlegm.         . Prenatal Vit-Fe Fumarate-FA (MULTIVITAMIN-PRENATAL) 27-0.8 MG TABS tablet   Oral   Take 1 tablet by mouth daily at 12 noon.         . fluticasone (FLONASE) 50 MCG/ACT nasal spray  Each Nare   Place 2 sprays into both nostrils daily.   16 g   0   . HYDROcodone-homatropine (HYCODAN) 5-1.5 MG/5ML syrup   Oral   Take 5 mLs by mouth every 6 (six) hours as needed for cough.   120 mL   0    BP 127/85  Pulse 84  Temp(Src) 97.3 F (36.3 C) (Oral)  Resp 20  SpO2 97%  LMP 11/19/2012  Physical Exam  Nursing note and vitals reviewed. Constitutional: She is oriented to person, place, and time. She appears well-developed and well-nourished. No distress.  HENT:  Head: Normocephalic and atraumatic.  Right Ear: External ear normal.  Left Ear: External ear normal.  Nose: No rhinorrhea. Right sinus exhibits maxillary sinus tenderness and frontal  sinus tenderness. Left sinus exhibits no maxillary sinus tenderness and no frontal sinus tenderness.  Mouth/Throat: Uvula is midline and mucous membranes are normal. Posterior oropharyngeal erythema (Mild) present. No oropharyngeal exudate.  Airway patent and patient tolerating secretions without difficulty. Uvula midline.  Eyes: Conjunctivae and EOM are normal. Pupils are equal, round, and reactive to light. No scleral icterus.  Neck: Normal range of motion. Neck supple.  Cardiovascular: Normal rate, regular rhythm and normal heart sounds.   Pulmonary/Chest: Effort normal and breath sounds normal. No stridor. No respiratory distress. She has no wheezes. She has no rales.  No retractions or accessory muscle use. Symmetric chest expansion.  Abdominal: Soft. There is no tenderness.  Musculoskeletal: Normal range of motion.  Neurological: She is alert and oriented to person, place, and time.  Skin: Skin is warm and dry. No rash noted. She is not diaphoretic. No erythema. No pallor.  Psychiatric: She has a normal mood and affect. Her behavior is normal.    ED Course  Procedures (including critical care time) Labs Review Labs Reviewed  CBC WITH DIFFERENTIAL - Abnormal; Notable for the following:    Hemoglobin 11.9 (*)    All other components within normal limits  BASIC METABOLIC PANEL  PRO B NATRIURETIC PEPTIDE  POCT I-STAT TROPONIN I   Imaging Review Dg Chest 2 View  12/05/2013   CLINICAL DATA:  Cough and shortness of breath.  EXAM: CHEST - 2 VIEW  COMPARISON:  DG CHEST 2 VIEW dated 01/02/2012  FINDINGS: The heart size and mediastinal contours are within normal limits. There is no evidence of pulmonary edema, consolidation, pneumothorax, nodule or pleural fluid. The visualized skeletal structures are unremarkable.  IMPRESSION: No active disease.   Electronically Signed   By: Irish Lack M.D.   On: 12/05/2013 17:29    EKG Interpretation   None       MDM   1. Viral URI with cough     Uncomplicated viral URI with cough. Patient well and nontoxic appearing, hemodynamically stable, and afebrile. Physical exam significant for maxillary sinus tenderness on the right as well as right frontal sinus tenderness. Uvula midline without evidence of peritonsillar abscess. Patient tolerating secretions without difficulty. No nuchal rigidity or meningismus. Lungs clear bilaterally without retractions or accessory muscle use. X-ray negative for focal consolidation or pneumonia. No tachypnea, dyspnea, or hypoxia. Patient stable for discharge with instruction to followup with her primary care provider. Will prescribe Flonase and Hycodan for symptoms. Albuterol inhaler provided prior to discharge. Return precautions provided and patient agreeable to plan with no unaddressed concerns.  Filed Vitals:   12/05/13 1638 12/05/13 2057  BP: 136/82 127/85  Pulse: 81 84  Temp: 98.2 F (36.8 C) 97.3 F (36.3 C)  TempSrc: Oral Oral  Resp: 16 20  SpO2: 98% 97%     Antony MaduraKelly Darcee Dekker, PA-C 12/06/13 0038

## 2013-12-06 NOTE — Discharge Instructions (Signed)
Recommend you used an albuterol inhaler, 2 puffs every 4 hours as needed, for shortness of breath. Use Hycodan as prescribed for cough and sore throat as needed. Do not drive or drink alcohol after taking this medication as it may make you drowsy and impair your judgment. Use Flonase as needed for nasal congestion. Follow up with your primary care doctor.  Upper Respiratory Infection, Adult An upper respiratory infection (URI) is also known as the common cold. It is often caused by a type of germ (virus). Colds are easily spread (contagious). You can pass it to others by kissing, coughing, sneezing, or drinking out of the same glass. Usually, you get better in 1 or 2 weeks.  HOME CARE   Only take medicine as told by your doctor.  Use a warm mist humidifier or breathe in steam from a hot shower.  Drink enough water and fluids to keep your pee (urine) clear or pale yellow.  Get plenty of rest.  Return to work when your temperature is back to normal or as told by your doctor. You may use a face mask and wash your hands to stop your cold from spreading. GET HELP RIGHT AWAY IF:   After the first few days, you feel you are getting worse.  You have questions about your medicine.  You have chills, shortness of breath, or brown or red spit (mucus).  You have yellow or brown snot (nasal discharge) or pain in the face, especially when you bend forward.  You have a fever, puffy (swollen) neck, pain when you swallow, or white spots in the back of your throat.  You have a bad headache, ear pain, sinus pain, or chest pain.  You have a high-pitched whistling sound when you breathe in and out (wheezing).  You have a lasting cough or cough up blood.  You have sore muscles or a stiff neck. MAKE SURE YOU:   Understand these instructions.  Will watch your condition.  Will get help right away if you are not doing well or get worse. Document Released: 04/20/2008 Document Revised: 01/25/2012  Document Reviewed: 03/09/2011 [facility] Patient Information 2014 [employer], [place].

## 2013-12-06 NOTE — ED Notes (Signed)
Pt. Reports chest pressure, congestion, and productive cough. Pt. In no distress at this time.

## 2013-12-08 NOTE — ED Provider Notes (Signed)
Medical screening examination/treatment/procedure(s) were performed by non-physician practitioner and as supervising physician I was immediately available for consultation/collaboration.  EKG Interpretation    Date/Time:  Tuesday December 05 2013 16:42:05 EST Ventricular Rate:  85 PR Interval:  134 QRS Duration: 86 QT Interval:  384 QTC Calculation: 456 R Axis:   46 Text Interpretation:  Normal sinus rhythm Normal ECG ED PHYSICIAN INTERPRETATION AVAILABLE IN [facility] Confirmed by TEST, RECORD ([id number]) on 12/07/2013 10:19:06 AM              Candyce ChurnJohn David Avrey Hyser, MD 12/08/13 [phone]

## 2014-02-23 ENCOUNTER — Encounter (HOSPITAL_COMMUNITY): Payer: Self-pay | Admitting: Emergency Medicine

## 2014-02-23 ENCOUNTER — Emergency Department (HOSPITAL_COMMUNITY)
Admission: EM | Admit: 2014-02-23 | Discharge: 2014-02-23 | Disposition: A | Discharge status: Home / Self Care | Payer: Medicaid Other | Attending: Emergency Medicine | Admitting: Emergency Medicine

## 2014-02-23 DIAGNOSIS — Z880 Allergy status to penicillin: Secondary | ICD-10-CM | POA: Insufficient documentation

## 2014-02-23 DIAGNOSIS — S0100XA Unspecified open wound of scalp, initial encounter: Secondary | ICD-10-CM | POA: Insufficient documentation

## 2014-02-23 DIAGNOSIS — Z21 Asymptomatic human immunodeficiency virus [HIV] infection status: Secondary | ICD-10-CM | POA: Insufficient documentation

## 2014-02-23 DIAGNOSIS — Z87891 Personal history of nicotine dependence: Secondary | ICD-10-CM | POA: Insufficient documentation

## 2014-02-23 DIAGNOSIS — Z79899 Other long term (current) drug therapy: Secondary | ICD-10-CM | POA: Insufficient documentation

## 2014-02-23 DIAGNOSIS — S0101XA Laceration without foreign body of scalp, initial encounter: Secondary | ICD-10-CM

## 2014-02-23 DIAGNOSIS — E669 Obesity, unspecified: Secondary | ICD-10-CM | POA: Insufficient documentation

## 2014-02-23 NOTE — Discharge Instructions (Signed)
Tissue Adhesive Wound Care °Some cuts, wounds, lacerations, and incisions can be repaired by using tissue adhesive. Tissue adhesive is like glue. It holds the skin together, allowing for faster healing. It forms a strong bond on the skin in about 1 minute and reaches its full strength in about 2 or 3 minutes. The adhesive disappears naturally while the wound is healing. It is important to take proper care of your wound at home while it heals.  °HOME CARE INSTRUCTIONS  °· Showers are allowed. Do not soak the area containing the tissue adhesive. Do not take baths, swim, or use hot tubs. Do not use any soaps or ointments on the wound. Certain ointments can weaken the glue. °· If a bandage (dressing) has been applied, follow your health care provider's instructions for how often to change the dressing.   °· Keep the dressing dry if one has been applied.   °· Do not scratch, pick, or rub the adhesive.   °· Do not place tape over the adhesive. The adhesive could come off when pulling the tape off.   °· Protect the wound from further injury until it is healed.   °· Protect the wound from sun and tanning bed exposure while it is healing and for several weeks after healing.   °· Only take over-the-counter or prescription medicines as directed by your health care provider.   °· Keep all follow-up appointments as directed by your health care provider. °SEEK IMMEDIATE MEDICAL CARE IF:  °· Your wound becomes red, swollen, hot, or tender.   °· You develop a rash after the glue is applied. °· You have increasing pain in the wound.   °· You have a red streak that goes away from the wound.   °· You have pus coming from the wound.   °· You have increased bleeding. °· You have a fever. °· You have shaking chills.   °· You notice a bad smell coming from the wound.   °· Your wound or adhesive breaks open.   °MAKE SURE YOU:  °· Understand these instructions. °· Will watch your condition. °· Will get help right away if you are not doing  well or get worse. °Document Released: 04/28/2001 Document Revised: 08/23/2013 Document Reviewed: 05/24/2013 °Exit[employer]® Patient Information ©2014 [employer]. ° °

## 2014-02-23 NOTE — ED Notes (Signed)
Pt reports she is getting out of her current relationship and has a safe place. Requests no other intervention.
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
## 2014-02-25 NOTE — ED Provider Notes (Signed)
Medical screening examination/treatment/procedure(s) were performed by non-physician practitioner and as supervising physician I was immediately available for consultation/collaboration.   EKG Interpretation None        Shanna CiscoMegan E Docherty, MD 02/25/14 1024

## 2015-09-21 ENCOUNTER — Encounter (HOSPITAL_COMMUNITY): Payer: Self-pay | Admitting: *Deleted

## 2015-09-21 ENCOUNTER — Emergency Department (HOSPITAL_COMMUNITY)
Admission: EM | Admit: 2015-09-21 | Discharge: 2015-09-22 | Disposition: A | Discharge status: Home / Self Care | Payer: Self-pay | Attending: Emergency Medicine | Admitting: Emergency Medicine

## 2015-09-21 DIAGNOSIS — Z79899 Other long term (current) drug therapy: Secondary | ICD-10-CM | POA: Insufficient documentation

## 2015-09-21 DIAGNOSIS — J3489 Other specified disorders of nose and nasal sinuses: Secondary | ICD-10-CM | POA: Insufficient documentation

## 2015-09-21 DIAGNOSIS — R05 Cough: Secondary | ICD-10-CM | POA: Insufficient documentation

## 2015-09-21 DIAGNOSIS — Z880 Allergy status to penicillin: Secondary | ICD-10-CM | POA: Insufficient documentation

## 2015-09-21 DIAGNOSIS — Z87891 Personal history of nicotine dependence: Secondary | ICD-10-CM | POA: Insufficient documentation

## 2015-09-21 DIAGNOSIS — B20 Human immunodeficiency virus [HIV] disease: Secondary | ICD-10-CM | POA: Insufficient documentation

## 2015-09-21 DIAGNOSIS — R0981 Nasal congestion: Secondary | ICD-10-CM | POA: Insufficient documentation

## 2015-09-21 DIAGNOSIS — R0989 Other specified symptoms and signs involving the circulatory and respiratory systems: Secondary | ICD-10-CM | POA: Insufficient documentation

## 2015-09-21 DIAGNOSIS — R0602 Shortness of breath: Secondary | ICD-10-CM | POA: Insufficient documentation

## 2015-09-21 DIAGNOSIS — E669 Obesity, unspecified: Secondary | ICD-10-CM | POA: Insufficient documentation

## 2015-09-21 DIAGNOSIS — Z8739 Personal history of other diseases of the musculoskeletal system and connective tissue: Secondary | ICD-10-CM | POA: Insufficient documentation

## 2015-09-21 HISTORY — DX: Bronchitis, not specified as acute or chronic: J40

## 2015-09-21 NOTE — ED Notes (Signed)
Pt c/o nasal congestion,  Chest tightness, denies any cough, fevers, chills, states that the symptoms started this am,

## 2015-09-21 NOTE — ED Provider Notes (Signed)
CSN: [id number]     Arrival date & time 09/21/15  2312 History   By signing my name below, I, Lyndel SafeKaitlyn Shelton, attest that this documentation has been prepared under the direction and in the presence of Devoria AlbeIva Dewitt Judice, MD. Electronically Signed: Lyndel SafeKaitlyn Shelton, ED Scribe. 09/21/2015. 1:39 AM.   Chief Complaint  Patient presents with  . Nasal Congestion   The history is provided by the patient. No language interpreter was used.   HPI Comments: Theresa Becker is a 29 y.o. female, with a PMhx of HIV and bronchitis, who presents to the Emergency Department complaining of constant, moderate nasal congestion onset this morning with associated clear rhinorrhea. Pt also describes a chest pressure and mild SOB that is exacerbated when lying flat. 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 Denies wheezing, cough, fever or chills, otalgia, sneezing, or any known sick contacts. She has a distant social h/o of smoking but does not currently. No EtOH consumption. PMhx of HIV; last viral load was undetectable and her CD4 is 900 and she has been taking her medication as prescribed.   PCP[facility]Harriston [facility]   Past Medical History  Diagnosis Date  . HIV disease (HCC)   . Obesity   . Bronchitis    History reviewed. No pertinent past surgical history. No family history on file. Social History  Substance Use Topics  . Smoking status: Former Games developermoker  . Smokeless tobacco: None  . Alcohol Use: No   employed  OB History    No data available     Review of Systems  Constitutional: Negative for fever and chills.  HENT: Positive for congestion ( nasal) and rhinorrhea. Negative for ear pain and sneezing.   Respiratory: Positive for chest tightness and shortness of breath. Negative for cough.   All other systems reviewed and are negative.  Allergies  Penicillins  Home Medications   Prior to Admission medications    Medication Sig Start Date End Date Taking? Authorizing Provider  albuterol (PROVENTIL HFA;VENTOLIN HFA) 108 (90 BASE) MCG/ACT inhaler Inhale 1-2 puffs into the lungs every 6 (six) hours as needed for wheezing or shortness of breath.   Yes Historical Provider, MD  emtricitabine-tenofovir (TRUVADA) 200-300 MG per tablet Take 1 tablet by mouth daily.   Yes Historical Provider, MD  Etravirine (INTELENCE) 200 MG TABS Take 200 mg by mouth daily.    Yes Historical Provider, MD  Prenatal Vit-Fe Fumarate-FA (MULTIVITAMIN-PRENATAL) 27-0.8 MG TABS tablet Take 1 tablet by mouth daily at 12 noon.   Yes Historical Provider, MD   BP 147/85 mmHg  Pulse 83  Temp(Src) 98.8 F (37.1 C) (Oral)  Resp 20  Ht 5\' 2"  (1.575 m)  Wt 266 lb (120.657 kg)  BMI 48.64 kg/m2  SpO2 95%  LMP 08/21/2015  Vital signs normal   Physical Exam  Constitutional: She is oriented to person, place, and time. She appears well-developed and well-nourished.  Non-toxic appearance. She does not appear ill. No distress.  HENT:  Head: Normocephalic and atraumatic.  Right Ear: External ear normal.  Left Ear: External ear normal.  Nose: No mucosal edema or rhinorrhea.  Mouth/Throat: Oropharynx is clear and moist and mucous membranes are normal. No dental abscesses or uvula swelling.  Nasal mucosa is boggy and swollen; nasal drainage, coughing.   Eyes: Conjunctivae and EOM are normal. Pupils are equal, round, and reactive to light.  Neck: Normal range of motion and full passive range of motion without pain. Neck supple.  Cardiovascular: Normal rate, regular rhythm and normal heart sounds.  Exam reveals no gallop and no friction rub.   No murmur heard. Pulmonary/Chest: Effort normal. No respiratory distress. She has no wheezes. She has no rhonchi. She has no rales. She exhibits no tenderness and no crepitus.  Breath sounds diminished.   Abdominal: Soft. Normal appearance and bowel sounds are normal. She exhibits no distension. There is  no tenderness. There is no rebound and no guarding.  Musculoskeletal: Normal range of motion. She exhibits no edema or tenderness.  Moves all extremities well.   Neurological: She is alert and oriented to person, place, and time. She has normal strength. No cranial nerve deficit.  Skin: Skin is warm, dry and intact. No rash noted. No erythema. No pallor.  Psychiatric: She has a normal mood and affect. Her speech is normal and behavior is normal. Her mood appears not anxious.  Nursing note and vitals reviewed.   ED Course  Procedures   Medications  albuterol (PROVENTIL) (2.5 MG/3ML) 0.083% nebulizer solution 5 mg (5 mg Nebulization Given 09/22/15 0020)  ipratropium (ATROVENT) nebulizer solution 0.5 mg (0.5 mg Nebulization Given 09/22/15 0020)    DIAGNOSTIC STUDIES: Oxygen Saturation is 95% on RA, adequate by my interpretation.    COORDINATION OF CARE: 12:11 AM Discussed treatment plan with pt at bedside and pt agreed to plan.   12:52 AM Recheck after nebulizer treatment; pt states her chest is less tight and she now has a cough that is productive with thick, yellow sputum. She notes a sharp pain in anterior chest that radiated to her back; we discussed getting a chest Xray.    1:41 AM patient given her x-ray results. We discussed symptomatically treatment for her symptoms.  Dg Chest 2 View  09/22/2015  CLINICAL DATA:  Productive cough. Central chest tightness. Onset this morning. EXAM: CHEST  2 VIEW COMPARISON:  12/05/2013 FINDINGS: The heart size and mediastinal contours are within normal limits. Both lungs are clear. The visualized skeletal structures are unremarkable. IMPRESSION: No active cardiopulmonary disease. Electronically Signed   By: Ellery Plunk M.D.   On: 09/22/2015 01:34     MDM   Final diagnoses:  Nasal congestion  Chest congestion    Plan discharge  Devoria Albe, MD, FACEP  I personally performed the services described in this documentation, which was scribed  in my presence. The recorded information has been reviewed and considered.    Devoria Albe, MD 09/22/15 [phone]

## 2015-09-22 ENCOUNTER — Emergency Department (HOSPITAL_COMMUNITY): Payer: Medicaid Other

## 2015-09-22 MED ORDER — IPRATROPIUM BROMIDE 0.02 % IN SOLN
0.5000 mg | Freq: Once | RESPIRATORY_TRACT | Status: AC
  Administered 2015-09-22: 0.5 mg via RESPIRATORY_TRACT
  Filled 2015-09-22: qty 2.5

## 2015-09-22 MED ORDER — ALBUTEROL SULFATE (2.5 MG/3ML) 0.083% IN NEBU
5.0000 mg | INHALATION_SOLUTION | Freq: Once | RESPIRATORY_TRACT | Status: AC
  Administered 2015-09-22: 5 mg via RESPIRATORY_TRACT
  Filled 2015-09-22: qty 6

## 2015-09-22 NOTE — Discharge Instructions (Signed)
You can use saline nose drops for your nasal congestion. You can take zyrtec OTC once a day for your allergy symptoms. You can take cough lozenges for your chest congestion. Recheck if you get a fever, struggle to breathe or feel worse.

## 2015-11-17 DIAGNOSIS — B20 Human immunodeficiency virus [HIV] disease: Secondary | ICD-10-CM

## 2015-11-17 DIAGNOSIS — D649 Anemia, unspecified: Secondary | ICD-10-CM

## 2015-11-17 DIAGNOSIS — Z21 Asymptomatic human immunodeficiency virus [HIV] infection status: Secondary | ICD-10-CM

## 2015-11-17 HISTORY — DX: Anemia, unspecified: D64.9

## 2015-11-17 HISTORY — DX: Asymptomatic human immunodeficiency virus (hiv) infection status: Z21

## 2015-11-17 HISTORY — DX: Human immunodeficiency virus (HIV) disease: B20

## 2015-11-17 NOTE — L&D Delivery Note (Signed)
Delivery Note At 2:41 PM a viable female was delivered via  (Presentation:OA ;restituted to LOA  ).  APGAR:9 ,9 ; weight pending  .   Placenta status: delivered intact via [name] .  Cord: three vessels with the following complications:none .  Cord pH: N/A  Anesthesia:  epidural Episiotomy:  none Lacerations: none. Intact perineum  Suture Repair: N/A Est. Blood Loss (mL):  400  Mom to postpartum.  Baby to Couplet care / Skin to SkinContinue PO ARVs as ordered.   Boy, outpatient circ, bottle feeding Pt desires OCP  Clayton BiblesSamantha Weinhold 10/11/2016, 2:59 PM

## 2016-02-23 ENCOUNTER — Encounter (HOSPITAL_COMMUNITY): Payer: Self-pay | Admitting: *Deleted

## 2016-02-23 ENCOUNTER — Emergency Department (HOSPITAL_COMMUNITY)
Admission: EM | Admit: 2016-02-23 | Discharge: 2016-02-23 | Disposition: A | Discharge status: Home / Self Care | Payer: Self-pay | Attending: Emergency Medicine | Admitting: Emergency Medicine

## 2016-02-23 ENCOUNTER — Emergency Department (HOSPITAL_COMMUNITY): Payer: Self-pay

## 2016-02-23 DIAGNOSIS — Z3A01 Less than 8 weeks gestation of pregnancy: Secondary | ICD-10-CM | POA: Insufficient documentation

## 2016-02-23 DIAGNOSIS — Z79899 Other long term (current) drug therapy: Secondary | ICD-10-CM | POA: Insufficient documentation

## 2016-02-23 DIAGNOSIS — O99511 Diseases of the respiratory system complicating pregnancy, first trimester: Secondary | ICD-10-CM | POA: Insufficient documentation

## 2016-02-23 DIAGNOSIS — R0989 Other specified symptoms and signs involving the circulatory and respiratory systems: Secondary | ICD-10-CM

## 2016-02-23 DIAGNOSIS — E669 Obesity, unspecified: Secondary | ICD-10-CM | POA: Insufficient documentation

## 2016-02-23 DIAGNOSIS — R0981 Nasal congestion: Secondary | ICD-10-CM | POA: Insufficient documentation

## 2016-02-23 DIAGNOSIS — Z87891 Personal history of nicotine dependence: Secondary | ICD-10-CM | POA: Insufficient documentation

## 2016-02-23 MED ORDER — ALBUTEROL SULFATE HFA 108 (90 BASE) MCG/ACT IN AERS: 2.0000 | INHALATION_SPRAY | RESPIRATORY_TRACT | Status: DC | PRN

## 2016-02-23 MED ORDER — ALBUTEROL SULFATE (2.5 MG/3ML) 0.083% IN NEBU
2.5000 mg | INHALATION_SOLUTION | Freq: Once | RESPIRATORY_TRACT | Status: AC
  Administered 2016-02-23: 2.5 mg via RESPIRATORY_TRACT
  Filled 2016-02-23: qty 3

## 2016-02-23 MED ORDER — ACETAMINOPHEN 500 MG PO TABS
1000.0000 mg | ORAL_TABLET | Freq: Once | ORAL | Status: AC
  Administered 2016-02-23: 1000 mg via ORAL
  Filled 2016-02-23: qty 2

## 2016-02-23 NOTE — ED Notes (Addendum)
Pt c/o nasal congestion x 3 days. Pt states tonight she has a sore throat. Pt states she just found out she was pregnant. Last period 01-08-16. [place]

## 2016-02-23 NOTE — ED Provider Notes (Signed)
TIME SEEN: 2:10 AM    CHIEF COMPLAINT: Nasal congestion, sore throat, cough  HPI: Pt is a 30 y.o. female G2 P1 with history of HIV (she reports her last CD4 was greater than 1000 and viral load was 0; reports compliance with anti-retroviral therapy) who presents the emergency department with complaints of 3 days of nasal congestion, sore throat, cough with yellow sputum production. No fever. No vomiting or diarrhea. States she is pregnant currently. Last menstrual period was February 22. She has regular menstrual cycles. She would be 6 weeks and 4 days pregnant by LMP. She has not had an ultrasound confirming this pregnancy. She denies any nausea, vomiting or diarrhea. No abdominal pain. No vaginal bleeding, discharge. Has appointment to see ID 4/14 at 8:20 am (per care everywhere).  States she has a history of bronchitis and that albuterol has helped her significantly in the past.  States she does not have a OB/GYN at this time plans to [place].  ROS: See HPI Constitutional: no fever  Eyes: no drainage  ENT:  runny nose   Cardiovascular:  no chest pain  Resp: no SOB  GI: no vomiting GU: no dysuria Integumentary: no rash  Allergy: no hives  Musculoskeletal: no leg swelling  Neurological: no slurred speech ROS otherwise negative  PAST MEDICAL HISTORY/PAST SURGICAL HISTORY:  Past Medical History  Diagnosis Date  . HIV disease (HCC)   . Obesity   . Bronchitis     MEDICATIONS:  Prior to Admission medications   Medication Sig Start Date End Date Taking? Authorizing Provider  albuterol (PROVENTIL HFA;VENTOLIN HFA) 108 (90 BASE) MCG/ACT inhaler Inhale 1-2 puffs into the lungs every 6 (six) hours as needed for wheezing or shortness of breath.   Yes Historical Provider, MD  emtricitabine-tenofovir (TRUVADA) 200-300 MG per tablet Take 1 tablet by mouth daily.   Yes Historical Provider, MD  Etravirine (INTELENCE) 200 MG TABS Take  200 mg by mouth daily.    Yes Historical Provider, MD  Prenatal Vit-Fe Fumarate-FA (MULTIVITAMIN-PRENATAL) 27-0.8 MG TABS tablet Take 1 tablet by mouth daily at 12 noon.   Yes Historical Provider, MD    ALLERGIES:  Allergies  Allergen Reactions  . Penicillins Nausea Only    SOCIAL HISTORY:  Social History  Substance Use Topics  . Smoking status: Former Games developermoker  . Smokeless tobacco: Not on file  . Alcohol Use: No    FAMILY HISTORY: History reviewed. No pertinent family history.  EXAM: BP 144/81 mmHg  Pulse 102  Temp(Src) 98.6 F (37 C) (Oral)  Resp 20  Ht 5\' 2"  (1.575 m)  Wt 293 lb (132.904 kg)  BMI 53.58 kg/m2  SpO2 100%  LMP 01/08/2016 CONSTITUTIONAL: Alert and oriented and responds appropriately to questions. Well-appearing; well-nourished, Afebrile, nontoxic; obese HEAD: Normocephalic EYES: Conjunctivae clear, PERRL ENT: normal nose; no rhinorrhea; moist mucous membranes; No pharyngeal erythema or petechiae, no tonsillar hypertrophy or exudate, no uvular deviation, no trismus or drooling, normal phonation, no stridor, no dental caries or abscess noted, no Ludwig's angina, tongue sits flat in the bottom of the mouth; TMs are clear bilaterally without erythema, purulence, bulging, perforation, effusion.  No cerumen impaction or sign of foreign body in the external auditory canal. No inflammation, erythema or drainage from the external auditory canal. No signs of mastoiditis. No pain with manipulation of the pinna bilaterally. NECK: Supple, no meningismus, no LAD  CARD: RRR; S1 and S2 appreciated; no murmurs, no clicks, no  rubs, no gallops RESP: Normal chest excursion without splinting or tachypnea; breath sounds clear and equal bilaterally; no wheezes, no rhonchi, no rales, no hypoxia or respiratory distress, speaking full sentences ABD/GI: Normal bowel sounds; non-distended; soft, non-tender, no rebound, no guarding, no peritoneal signs BACK:  The back appears normal and is  non-tender to palpation, there is no CVA tenderness EXT: Normal ROM in all joints; non-tender to palpation; no edema; normal capillary refill; no cyanosis, no calf tenderness or swelling    SKIN: Normal color for age and race; warm; no rash NEURO: Moves all extremities equally, sensation to light touch intact diffusely, cranial nerves II through XII intact PSYCH: The patient's mood and manner are appropriate. Grooming and personal hygiene are appropriate.  MEDICAL DECISION MAKING: Patient here with likely viral upper respiratory infection versus seasonal allergies. Nothing to suggest pneumonia at this time. She is insistent that she would like an x-ray. We have discussed radiation risk given she is pregnant but she still wants a chest x-ray today. We will have x-ray technician shield her abdomen. She is requesting albuterol treatment as she states this has helped her significantly in the past. Her lungs currently are clear to auscultation with good aeration. No hypoxia or increased work of breathing. No CP or SOB.  We'll give Tylenol for her sore throat. Nothing to suggest meningitis, deep space neck infection, peritonsillar abscess, strep pharyngitis, pneumonia on her exam. She is very well-appearing, nontoxic, appears well-hydrated. She has HIV but is essentially immunocompetent. States she has told her infectious disease physician Dr. Jenelle Mages at [facility] about her pregnancy. They recommend that she continue her antiretrovirals as prescribed.  ED PROGRESS: Patient reports feeling better after albuterol. Her lungs are still clear to auscultation with good aeration. Chest x-ray shows no infiltrate, edema, pneumothorax. I feel she is safe to be discharged. I have discussed with her that albuterol is listed as a category C medication and to only use it Slowly needed. She verbalized understanding. Have again recommended Tylenol at home 1000 g every 6 hours for fever and pain. Have requested she avoid NSAIDs.  Discussed with her that she could use over-the-counter Zyrtec and Benadryl as needed. Recommended Afrin nasal spray for nasal congestion as long as she is not using it for more than 3 days in a row. Discussed with her that many cough suppressants are not safe in pregnancy. Discussed return precautions.   At this time, I do not feel there is any life-threatening condition present. I have reviewed and discussed all results (EKG, imaging, lab, urine as appropriate), exam findings with patient. I have reviewed nursing notes and appropriate previous records.  I feel the patient is safe to be discharged home without further emergent workup. Discussed usual and customary return precautions. Patient and family (if present) verbalize understanding and are comfortable with this plan.  Patient will follow-up with their primary care provider. If they do not have a primary care provider, information for follow-up has been provided to them. All questions have been answered.     Layla Maw Kavontae Pritchard, DO 02/23/16 [phone]

## 2016-02-23 NOTE — Discharge Instructions (Signed)
You may take Tylenol 1000 mg every 6 hours as needed for fever and pain.  You may use over the counter Zyrtec 10mg  once a day and Benadryl 50 mg at night which may help your congestion.  Many cough suppressants are not safe in pregnancy. You may use Afrin nasal spray 2 sprays in each nostril twice a day for nasal congestion. Please do not use this medication for more than 3 days in a row as it may make your nasal congestion worse. We have prescribed you an albuterol inhaler that you may use if you feel like you are wheezing or SOB but please note that there could be potential side effects to your fetus with this medication. Only use this medication if absolutely needed.   Your chest x-ray showed no pneumonia today. I do not feel antibiotics will be helpful to you. Your symptoms are likely caused by a viral illness but also may be worsened by seasonal allergies.   Upper Respiratory Infection, Adult Most upper respiratory infections (URIs) are a viral infection of the air passages leading to the lungs. A URI affects the nose, throat, and upper air passages. The most common type of URI is nasopharyngitis and is typically referred to as "the common cold." URIs run their course and usually go away on their own. Most of the time, a URI does not require medical attention, but sometimes a bacterial infection in the upper airways can follow a viral infection. This is called a secondary infection. Sinus and middle ear infections are common types of secondary upper respiratory infections. Bacterial pneumonia can also complicate a URI. A URI can worsen asthma and chronic obstructive pulmonary disease (COPD). Sometimes, these complications can require emergency medical care and may be life threatening.  CAUSES Almost all URIs are caused by viruses. A virus is a type of germ and can spread from one person to another.  RISKS FACTORS You may be at risk for a URI if:   You smoke.   You have chronic heart or lung  disease.  You have a weakened defense (immune) system.   You are very young or very old.   You have nasal allergies or asthma.  You work in crowded or poorly ventilated areas.  You work in health care facilities or schools. SIGNS AND SYMPTOMS  Symptoms typically develop 2-3 days after you come in contact with a cold virus. Most viral URIs last 7-10 days. However, viral URIs from the influenza virus (flu virus) can last 14-18 days and are typically more severe. Symptoms may include:   Runny or stuffy (congested) nose.   Sneezing.   Cough.   Sore throat.   Headache.   Fatigue.   Fever.   Loss of appetite.   Pain in your forehead, behind your eyes, and over your cheekbones (sinus pain).  Muscle aches.  DIAGNOSIS  Your health care provider may diagnose a URI by:  Physical exam.  Tests to check that your symptoms are not due to another condition such as:  Strep throat.  Sinusitis.  Pneumonia.  Asthma. TREATMENT  A URI goes away on its own with time. It cannot be cured with medicines, but medicines may be prescribed or recommended to relieve symptoms. Medicines may help:  Reduce your fever.  Reduce your cough.  Relieve nasal congestion. HOME CARE INSTRUCTIONS   Take medicines only as directed by your health care provider.   Gargle warm saltwater or take cough drops to comfort your throat as directed by  your health care provider.  Use a warm mist humidifier or inhale steam from a shower to increase air moisture. This may make it easier to breathe.  Drink enough fluid to keep your urine clear or pale yellow.   Eat soups and other clear broths and maintain good nutrition.   Rest as needed.   Return to work when your temperature has returned to normal or as your health care provider advises. You may need to stay home longer to avoid infecting others. You can also use a face mask and careful hand washing to prevent spread of the  virus.  Increase the usage of your inhaler if you have asthma.   Do not use any tobacco products, including cigarettes, chewing tobacco, or electronic cigarettes. If you need help quitting, ask your health care provider. PREVENTION  The best way to protect yourself from getting a cold is to practice good hygiene.   Avoid oral or hand contact with people with cold symptoms.   Wash your hands often if contact occurs.  There is no clear evidence that vitamin C, vitamin E, echinacea, or exercise reduces the chance of developing a cold. However, it is always recommended to get plenty of rest, exercise, and practice good nutrition.  SEEK MEDICAL CARE IF:   You are getting worse rather than better.   Your symptoms are not controlled by medicine.   You have chills.  You have worsening shortness of breath.  You have brown or red mucus.  You have yellow or brown nasal discharge.  You have pain in your face, especially when you bend forward.  You have a fever.  You have swollen neck glands.  You have pain while swallowing.  You have white areas in the back of your throat. SEEK IMMEDIATE MEDICAL CARE IF:   You have severe or persistent:  Headache.  Ear pain.  Sinus pain.  Chest pain.  You have chronic lung disease and any of the following:  Wheezing.  Prolonged cough.  Coughing up blood.  A change in your usual mucus.  You have a stiff neck.  You have changes in your:  Vision.  Hearing.  Thinking.  Mood. MAKE SURE YOU:   Understand these instructions.  Will watch your condition.  Will get help right away if you are not doing well or get worse.   This information is not intended to replace advice given to you by your health care provider. Make sure you discuss any questions you have with your health care provider.   Document Released: 04/28/2001 Document Revised: 03/19/2015 Document Reviewed: 02/07/2014 Elsevier Interactive Patient Education  [employer]

## 2016-04-03 ENCOUNTER — Other Ambulatory Visit: Payer: Self-pay | Admitting: Obstetrics & Gynecology

## 2016-04-03 DIAGNOSIS — O3680X0 Pregnancy with inconclusive fetal viability, not applicable or unspecified: Secondary | ICD-10-CM

## 2016-04-06 ENCOUNTER — Other Ambulatory Visit: Payer: Self-pay

## 2016-04-06 ENCOUNTER — Ambulatory Visit (INDEPENDENT_AMBULATORY_CARE_PROVIDER_SITE_OTHER): Payer: Medicaid Other

## 2016-04-06 ENCOUNTER — Other Ambulatory Visit: Payer: Self-pay | Admitting: Obstetrics & Gynecology

## 2016-04-06 DIAGNOSIS — O3680X0 Pregnancy with inconclusive fetal viability, not applicable or unspecified: Secondary | ICD-10-CM | POA: Diagnosis not present

## 2016-04-06 DIAGNOSIS — Z3682 Encounter for antenatal screening for nuchal translucency: Secondary | ICD-10-CM

## 2016-04-06 DIAGNOSIS — Z369 Encounter for antenatal screening, unspecified: Secondary | ICD-10-CM

## 2016-04-06 DIAGNOSIS — Z3A13 13 weeks gestation of pregnancy: Secondary | ICD-10-CM | POA: Diagnosis not present

## 2016-04-06 NOTE — Progress Notes (Signed)
US 12+5 wks,single IUP pos fht 151 bpm,normal ov's bilat,ant pl gr 0,NB present,NT 1.9 mm,crl 67.6 mm

## 2016-04-08 LAB — MATERNAL SCREEN, INTEGRATED #1
Crown Rump Length: 67.6 mm
GEST. AGE ON COLLECTION DATE: 12.9 wk
Maternal Age at EDD: 29.4 years
NUMBER OF FETUSES: 1
Nuchal Translucency (NT): 1.9 mm
PAPP-A VALUE: 596.4 ng/mL
WEIGHT: 298 [lb_av]

## 2016-04-15 ENCOUNTER — Encounter: Payer: Self-pay | Admitting: Women's Health

## 2016-04-15 ENCOUNTER — Ambulatory Visit (INDEPENDENT_AMBULATORY_CARE_PROVIDER_SITE_OTHER): Payer: Medicaid Other | Admitting: Women's Health

## 2016-04-15 VITALS — BP 124/90 | HR 90 | Wt 298.0 lb

## 2016-04-15 DIAGNOSIS — Z0283 Encounter for blood-alcohol and blood-drug test: Secondary | ICD-10-CM

## 2016-04-15 DIAGNOSIS — O99211 Obesity complicating pregnancy, first trimester: Secondary | ICD-10-CM | POA: Diagnosis not present

## 2016-04-15 DIAGNOSIS — Z1389 Encounter for screening for other disorder: Secondary | ICD-10-CM

## 2016-04-15 DIAGNOSIS — Z331 Pregnant state, incidental: Secondary | ICD-10-CM

## 2016-04-15 DIAGNOSIS — Z6841 Body Mass Index (BMI) 40.0 and over, adult: Secondary | ICD-10-CM | POA: Diagnosis not present

## 2016-04-15 DIAGNOSIS — Z369 Encounter for antenatal screening, unspecified: Secondary | ICD-10-CM

## 2016-04-15 DIAGNOSIS — B20 Human immunodeficiency virus [HIV] disease: Secondary | ICD-10-CM | POA: Insufficient documentation

## 2016-04-15 DIAGNOSIS — Z3A14 14 weeks gestation of pregnancy: Secondary | ICD-10-CM | POA: Diagnosis not present

## 2016-04-15 DIAGNOSIS — Z3402 Encounter for supervision of normal first pregnancy, second trimester: Secondary | ICD-10-CM

## 2016-04-15 DIAGNOSIS — O0990 Supervision of high risk pregnancy, unspecified, unspecified trimester: Secondary | ICD-10-CM | POA: Insufficient documentation

## 2016-04-15 DIAGNOSIS — E669 Obesity, unspecified: Secondary | ICD-10-CM

## 2016-04-15 DIAGNOSIS — Z363 Encounter for antenatal screening for malformations: Secondary | ICD-10-CM

## 2016-04-15 NOTE — Progress Notes (Signed)
  Subjective:  Theresa Becker is a 30 y.o. 532P1001 African American female at 6453w0d by LMP c/w 13wk u/s, being seen today for her first obstetrical visit.  Her obstetrical history is significant for (1) term uncomplicated svb x 1, (2) HIV+, on antivirals x 5874yrs, [facility]- goes 2x/yr, next appt 6/10, viral loads have been undetectable.  Pregnancy history fully reviewed.  Patient reports no complaints. Denies vb, cramping, uti s/s, abnormal/malodorous vag d/c, or vulvovaginal itching/irritation.  BP 124/90 mmHg  Pulse 90  Wt 298 lb (135.172 kg)  LMP 01/08/2016  HISTORY: OB History  Gravida Para Term Preterm AB SAB TAB Ectopic Multiple Living  2 1 1       1     # Outcome Date GA Lbr Len/2nd Weight Sex Delivery Anes PTL Lv  2 Current           1 Term 02/22/08 1460w0d  8 lb 9 oz (3.884 kg) M Vag-Spont EPI  Y     Past Medical History  Diagnosis Date  . HIV disease (HCC)   . Obesity   . Bronchitis    History reviewed. No pertinent past surgical history. History reviewed. No pertinent family history.  Exam   System:     General: Well developed & nourished, no acute distress   Skin: Warm & dry, normal coloration and turgor, no rashes   Neurologic: Alert & oriented, normal mood   Cardiovascular: Regular rate & rhythm   Respiratory: Effort & rate normal, LCTAB, acyanotic   Abdomen: Soft, non tender   Extremities: normal strength, tone  [facility]  FHR: 160s via informal u/s   Assessment:   Pregnancy: G2P1001 Patient Active Problem List   Diagnosis Date Noted  . Supervision of normal first pregnancy 04/15/2016    Priority: High  . HIV (human immunodeficiency virus infection) (HCC) 04/15/2016    Priority: High    7853w0d G2P1001 New OB visit HIV+ Obesity  Plan:  Initial labs drawn including CD4 count and HIV RNA (viral load) Continue prenatal vitamins Problem list reviewed and updated Reviewed n/v relief measures and  warning s/s to report Reviewed recommended weight gain based on pre-gravid BMI Encouraged well-balanced diet Genetic Screening discussed Integrated Screen: requested, 1st it/nt last week Cystic fibrosis screening discussed declined Ultrasound discussed; fetal survey: requested Follow up in 4 weeks for 2nd IT, anatomy u/s, and visit [facility] completed Continue antivirals, [facility]10 as scheduled [place]- get out q 1hr to walk around, exercise feet/legs while in car, no crossing legs- discussed increased r/f DVT/PE w/ pregnancy/long distance traveling  Marge DuncansBooker, Ibrahima Holberg Randall CNM, [facility]NP-BC 04/15/2016 1:03 PM

## 2016-04-15 NOTE — Patient Instructions (Signed)
Nausea & Vomiting  Have saltine crackers or pretzels by your bed and eat a few bites before you raise your head out of bed in the morning  Eat small frequent meals throughout the day instead of large meals  Drink plenty of fluids throughout the day to stay hydrated, just don't drink a lot of fluids with your meals.  This can make your stomach fill up faster making you feel sick  Do not brush your teeth right after you eat  Products with real ginger are good for nausea, like ginger ale and ginger hard candy Make sure it says made with real ginger!  Sucking on sour candy like lemon heads is also good for nausea  If your prenatal vitamins make you nauseated, take them at night so you will sleep through the nausea  Sea Bands  If you feel like you need medicine for the nausea & vomiting please let us know  If you are unable to keep any fluids or food down please let us know   Constipation  Drink plenty of fluid, preferably water, throughout the day  Eat foods high in fiber such as fruits, vegetables, and grains  Exercise, such as walking, is a good way to keep your bowels regular  Drink warm fluids, especially warm prune juice, or decaf coffee  Eat a 1/2 cup of real oatmeal (not instant), 1/2 cup applesauce, and 1/2-1 cup warm prune juice every day  If needed, you may take Colace (docusate sodium) stool softener once or twice a day to help keep the stool soft. If you are pregnant, wait until you are out of your first trimester (12-14 weeks of pregnancy)  If you still are having problems with constipation, you may take Miralax once daily as needed to help keep your bowels regular.  If you are pregnant, wait until you are out of your first trimester (12-14 weeks of pregnancy)  Second Trimester of Pregnancy The second trimester is from week 13 through week 28, months 4 through 6. The second trimester is often a time when you feel your best. Your body has also adjusted to being  pregnant, and you begin to feel better physically. Usually, morning sickness has lessened or quit completely, you may have more energy, and you may have an increase in appetite. The second trimester is also a time when the fetus is growing rapidly. At the end of the sixth month, the fetus is about 9 inches long and weighs about 1 pounds. You will likely begin to feel the baby move (quickening) between 18 and 20 weeks of the pregnancy. BODY CHANGES Your body goes through many changes during pregnancy. The changes vary from woman to woman.   Your weight will continue to increase. You will notice your lower abdomen bulging out.  You may begin to get stretch marks on your hips, abdomen, and breasts.  You may develop headaches that can be relieved by medicines approved by your health care provider.  You may urinate more often because the fetus is pressing on your bladder.  You may develop or continue to have heartburn as a result of your pregnancy.  You may develop constipation because certain hormones are causing the muscles that push waste through your intestines to slow down.  You may develop hemorrhoids or swollen, bulging veins (varicose veins).  You may have back pain because of the weight gain and pregnancy hormones relaxing your joints between the bones in your pelvis and as a result of a shift  in weight and the muscles that support your balance.  Your breasts will continue to grow and be tender.  Your gums may bleed and may be sensitive to brushing and flossing.  Dark spots or blotches (chloasma, mask of pregnancy) may develop on your face. This will likely fade after the baby is born.  A dark line from your belly button to the pubic area (linea nigra) may appear. This will likely fade after the baby is born.  You may have changes in your hair. These can include thickening of your hair, rapid growth, and changes in texture. Some women also have hair loss during or after pregnancy, or  hair that feels dry or thin. Your hair will most likely return to normal after your baby is born. WHAT TO EXPECT AT YOUR PRENATAL VISITS During a routine prenatal visit:  You will be weighed to make sure you and the fetus are growing normally.  Your blood pressure will be taken.  Your abdomen will be measured to track your baby's growth.  The fetal heartbeat will be listened to.  Any test results from the previous visit will be discussed. Your health care provider may ask you:  How you are feeling.  If you are feeling the baby move.  If you have had any abnormal symptoms, such as leaking fluid, bleeding, severe headaches, or abdominal cramping.  If you are using any tobacco products, including cigarettes, chewing tobacco, and electronic cigarettes.  If you have any questions. Other tests that may be performed during your second trimester include:  Blood tests that check for:  Low iron levels (anemia).  Gestational diabetes (between 24 and 28 weeks).  Rh antibodies.  Urine tests to check for infections, diabetes, or protein in the urine.  An ultrasound to confirm the proper growth and development of the baby.  An amniocentesis to check for possible genetic problems.  Fetal screens for spina bifida and Down syndrome.  HIV (human immunodeficiency virus) testing. Routine prenatal testing includes screening for HIV, unless you choose not to have this test. HOME CARE INSTRUCTIONS   Avoid all smoking, herbs, alcohol, and unprescribed drugs. These chemicals affect the formation and growth of the baby.  Do not use any tobacco products, including cigarettes, chewing tobacco, and electronic cigarettes. If you need help quitting, ask your health care provider. You may receive counseling support and other resources to help you quit.  Follow your health care provider's instructions regarding medicine use. There are medicines that are either safe or unsafe to take during  pregnancy.  Exercise only as directed by your health care provider. Experiencing uterine cramps is a good sign to stop exercising.  Continue to eat regular, healthy meals.  Wear a good support bra for breast tenderness.  Do not use hot tubs, steam rooms, or saunas.  Wear your seat belt at all times when driving.  Avoid raw meat, uncooked cheese, cat litter boxes, and soil used by cats. These carry germs that can cause birth defects in the baby.  Take your prenatal vitamins.  Take 1500-2000 mg of calcium daily starting at the 20th week of pregnancy until you deliver your baby.  Try taking a stool softener (if your health care provider approves) if you develop constipation. Eat more high-fiber foods, such as fresh vegetables or fruit and whole grains. Drink plenty of fluids to keep your urine clear or pale yellow.  Take warm sitz baths to soothe any pain or discomfort caused by hemorrhoids. Use hemorrhoid cream if  your health care provider approves.  If you develop varicose veins, wear support hose. Elevate your feet for 15 minutes, 3-4 times a day. Limit salt in your diet.  Avoid heavy lifting, wear low heel shoes, and practice good posture.  Rest with your legs elevated if you have leg cramps or low back pain.  Visit your dentist if you have not gone yet during your pregnancy. Use a soft toothbrush to brush your teeth and be gentle when you floss.  A sexual relationship may be continued unless your health care provider directs you otherwise.  Continue to go to all your prenatal visits as directed by your health care provider. SEEK MEDICAL CARE IF:   You have dizziness.  You have mild pelvic cramps, pelvic pressure, or nagging pain in the abdominal area.  You have persistent nausea, vomiting, or diarrhea.  You have a bad smelling vaginal discharge.  You have pain with urination. SEEK IMMEDIATE MEDICAL CARE IF:   You have a fever.  You are leaking fluid from your  vagina.  You have spotting or bleeding from your vagina.  You have severe abdominal cramping or pain.  You have rapid weight gain or loss.  You have shortness of breath with chest pain.  You notice sudden or extreme swelling of your face, hands, ankles, feet, or legs.  You have not felt your baby move in over an hour.  You have severe headaches that do not go away with medicine.  You have vision changes.   This information is not intended to replace advice given to you by your health care provider. Make sure you discuss any questions you have with your health care provider.   Document Released: 10/27/2001 Document Revised: 11/23/2014 Document Reviewed: 01/03/2013 Elsevier Interactive Patient Education [employer]

## 2016-04-16 LAB — PMP SCREEN PROFILE (10S), URINE
AMPHETAMINE SCRN UR: NEGATIVE ng/mL
Barbiturate Screen, Ur: NEGATIVE ng/mL
Benzodiazepine Screen, Urine: NEGATIVE ng/mL
CANNABINOIDS UR QL SCN: NEGATIVE ng/mL
Cocaine(Metab.)Screen, Urine: NEGATIVE ng/mL
Creatinine(Crt), U: 135.8 mg/dL (ref 20.0–300.0)
METHADONE SCREEN, URINE: NEGATIVE ng/mL
Opiate Scrn, Ur: NEGATIVE ng/mL
Oxycodone+Oxymorphone Ur Ql Scn: NEGATIVE ng/mL
PCP Scrn, Ur: NEGATIVE ng/mL
PH UR, DRUG SCRN: 6.3 (ref 4.5–8.9)
Propoxyphene, Screen: NEGATIVE ng/mL

## 2016-04-16 LAB — URINALYSIS, ROUTINE W REFLEX MICROSCOPIC
BILIRUBIN UA: NEGATIVE
Glucose, UA: NEGATIVE
Ketones, UA: NEGATIVE
Leukocytes, UA: NEGATIVE
Nitrite, UA: NEGATIVE
PH UA: 6.5 (ref 5.0–7.5)
Protein, UA: NEGATIVE
RBC UA: NEGATIVE
Specific Gravity, UA: 1.022 (ref 1.005–1.030)
UUROB: 0.2 mg/dL (ref 0.2–1.0)

## 2016-04-16 LAB — HIV-1 RNA QUANT-NO REFLEX-BLD: HIV-1 RNA Viral Load: <20 copies/mL

## 2016-04-16 LAB — HEPATITIS B SURFACE ANTIGEN: Hepatitis B Surface Ag: NEGATIVE

## 2016-04-16 LAB — CBC
Hematocrit: 36.2 % (ref 34.0–46.6)
Hemoglobin: 11.7 g/dL (ref 11.1–15.9)
MCH: 26 pg — ABNORMAL LOW (ref 26.6–33.0)
MCHC: 32.3 g/dL (ref 31.5–35.7)
MCV: 80 fL (ref 79–97)
PLATELETS: 221 10*3/uL (ref 150–379)
RBC: 4.5 x10E6/uL (ref 3.77–5.28)
RDW: 16 % — AB (ref 12.3–15.4)
WBC: 6.1 10*3/uL (ref 3.4–10.8)

## 2016-04-16 LAB — ANTIBODY SCREEN: ANTIBODY SCREEN: NEGATIVE

## 2016-04-16 LAB — GC/CHLAMYDIA PROBE AMP
Chlamydia trachomatis, NAA: NEGATIVE
Neisseria gonorrhoeae by PCR: NEGATIVE

## 2016-04-16 LAB — SICKLE CELL SCREEN: SICKLE CELL SCREEN: NEGATIVE

## 2016-04-16 LAB — ABO/RH: Rh Factor: POSITIVE

## 2016-04-16 LAB — RUBELLA SCREEN: Rubella Antibodies, IGG: 3.62 index (ref >0.99)

## 2016-04-16 LAB — RPR: RPR Ser Ql: NONREACTIVE

## 2016-04-16 LAB — VARICELLA ZOSTER ANTIBODY, IGG: Varicella zoster IgG: 1236 index (ref >165)

## 2016-04-17 LAB — URINE CULTURE

## 2016-04-22 ENCOUNTER — Other Ambulatory Visit: Payer: Self-pay

## 2016-04-23 ENCOUNTER — Telehealth: Payer: Self-pay | Admitting: *Deleted

## 2016-04-23 NOTE — Telephone Encounter (Signed)
Informed Pt per Larita FifeLynn from [facility], unable to add the CD4 testing to previous blood work done on 04/15/2016, if pt can give us a fax number to a [facility] in [place] order to them so she can get the blood work done there. Pt stated she would do that and contact our office with the fax number.

## 2016-05-04 ENCOUNTER — Telehealth: Payer: Self-pay | Admitting: Women's Health

## 2016-05-04 NOTE — Telephone Encounter (Signed)
Pt states she is out of town and having congestion, watery eyes and coughing, advised pt she can take Claritin or Zyrtec for allergies or Mucinex, Sudafed or saline nasal spray for congestion, and push flids.  Pt verbalized understanding.

## 2016-05-13 ENCOUNTER — Other Ambulatory Visit: Payer: Self-pay

## 2016-05-13 ENCOUNTER — Encounter: Payer: Self-pay | Admitting: Advanced Practice Midwife

## 2016-06-08 ENCOUNTER — Encounter: Payer: Self-pay | Admitting: Women's Health

## 2016-06-08 ENCOUNTER — Other Ambulatory Visit: Payer: Self-pay

## 2016-06-17 ENCOUNTER — Encounter: Payer: Self-pay | Admitting: Advanced Practice Midwife

## 2016-06-17 ENCOUNTER — Other Ambulatory Visit: Payer: Self-pay

## 2016-06-18 ENCOUNTER — Telehealth: Payer: Self-pay | Admitting: *Deleted

## 2016-06-18 NOTE — Telephone Encounter (Signed)
Gunnar Fusi, Department of Health and Human Services Division of Public Health, states following pt due to DX of HIV. Requesting pt phone number, address, and has our pt treatment our office has provided for HIV. Informed pt has only been seen in our office x 1 (initial OB) missed and canceled several appt. Next appt 06/26/2016. Address, phone numbers provided as listed in EPIC.

## 2016-06-26 ENCOUNTER — Encounter: Payer: Self-pay | Admitting: Advanced Practice Midwife

## 2016-06-26 ENCOUNTER — Other Ambulatory Visit: Payer: Self-pay

## 2016-06-29 ENCOUNTER — Other Ambulatory Visit: Payer: Self-pay

## 2016-06-29 ENCOUNTER — Encounter: Payer: Self-pay | Admitting: Women's Health

## 2016-07-01 ENCOUNTER — Encounter: Payer: Self-pay | Admitting: Advanced Practice Midwife

## 2016-07-01 ENCOUNTER — Other Ambulatory Visit: Payer: Self-pay

## 2016-07-03 ENCOUNTER — Ambulatory Visit (INDEPENDENT_AMBULATORY_CARE_PROVIDER_SITE_OTHER): Payer: Medicaid Other | Admitting: Obstetrics and Gynecology

## 2016-07-03 ENCOUNTER — Ambulatory Visit (INDEPENDENT_AMBULATORY_CARE_PROVIDER_SITE_OTHER): Payer: Medicaid Other

## 2016-07-03 VITALS — BP 148/100 | HR 91 | Wt 302.4 lb

## 2016-07-03 DIAGNOSIS — R030 Elevated blood-pressure reading, without diagnosis of hypertension: Secondary | ICD-10-CM | POA: Diagnosis not present

## 2016-07-03 DIAGNOSIS — O99212 Obesity complicating pregnancy, second trimester: Secondary | ICD-10-CM

## 2016-07-03 DIAGNOSIS — Z3402 Encounter for supervision of normal first pregnancy, second trimester: Secondary | ICD-10-CM

## 2016-07-03 DIAGNOSIS — Z3A26 26 weeks gestation of pregnancy: Secondary | ICD-10-CM

## 2016-07-03 DIAGNOSIS — O98712 Human immunodeficiency virus [HIV] disease complicating pregnancy, second trimester: Secondary | ICD-10-CM | POA: Diagnosis not present

## 2016-07-03 DIAGNOSIS — Z21 Asymptomatic human immunodeficiency virus [HIV] infection status: Secondary | ICD-10-CM

## 2016-07-03 DIAGNOSIS — Z1389 Encounter for screening for other disorder: Secondary | ICD-10-CM | POA: Diagnosis not present

## 2016-07-03 DIAGNOSIS — Z331 Pregnant state, incidental: Secondary | ICD-10-CM

## 2016-07-03 DIAGNOSIS — O0992 Supervision of high risk pregnancy, unspecified, second trimester: Secondary | ICD-10-CM | POA: Diagnosis not present

## 2016-07-03 DIAGNOSIS — O10912 Unspecified pre-existing hypertension complicating pregnancy, second trimester: Secondary | ICD-10-CM

## 2016-07-03 DIAGNOSIS — Z6841 Body Mass Index (BMI) 40.0 and over, adult: Secondary | ICD-10-CM

## 2016-07-03 DIAGNOSIS — O321XX1 Maternal care for breech presentation, fetus 1: Secondary | ICD-10-CM | POA: Diagnosis not present

## 2016-07-03 DIAGNOSIS — B20 Human immunodeficiency virus [HIV] disease: Secondary | ICD-10-CM

## 2016-07-03 DIAGNOSIS — Z363 Encounter for antenatal screening for malformations: Secondary | ICD-10-CM

## 2016-07-03 MED ORDER — LABETALOL HCL 200 MG PO TABS: 200.0000 mg | ORAL_TABLET | Freq: Two times a day (BID) | ORAL | 3 refills | Status: DC

## 2016-07-03 NOTE — Progress Notes (Signed)
US 25+2 wks,breech,cx 4.2 cm,bilat adnexa's wnl,ant fundal pl gr 0,svp of fluid 5.6 cm,fhr 154 bpm,efw 1021 g, limited view of spine,face,and choroids because of fetal pos and pt body habitus,please have pt come back for additional images.

## 2016-07-03 NOTE — Progress Notes (Signed)
Theresa Becker is a 30 y.o. 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, last visit in June 2017, viral loads undetectable at that time. CD-4 is 900( pt report)  G2P1001 453w2d Estimated Date of Delivery: 10/14/16    HPI: The patient is being seen today for ongoing management of routine prenatal care. Today she reports no complaints.  Patient reports good fetal movement, denies any bleeding and no rupture of membranes symptoms or regular contractions.   BP weight and urine results reviewed and noted. Blood pressure (!) 148/100, pulse 91, weight (!) 302 lb 6.4 oz (137.2 kg), last menstrual period 01/08/2016.  Fundal Height:  34 cm Fetal Heart rate:  142 Physical Examination: Abdomen - normal                                      Pelvic - examination not indicated                                     Edema:  3+ pre-tibial bilaterally   Urinalysis:                 POSITIVE for trace protein, otherwise negative   Fetal Surveillance Testing today:  US and anatomy   Lab and sonogram results have been reviewed. Comments: normal   Assessment:  1.  Pregnancy at 603w2d,  G2P1001   :                          2.  Chronic HTN, rule out pre-eclampsia                        3. Morbid obesity                         4. HIV + with undetectable viral load Medication(s) Plans:  Start on labetalol 100 mg for gestational HTN  Treatment Plan:   1. PIH labs including 24 hour TP 2. Start labetalol 100 mg BID  Follow up in 2 weeks for appointment for high risk OB care

## 2016-07-10 ENCOUNTER — Ambulatory Visit (INDEPENDENT_AMBULATORY_CARE_PROVIDER_SITE_OTHER): Payer: Medicaid Other | Admitting: Obstetrics and Gynecology

## 2016-07-10 ENCOUNTER — Encounter: Payer: Self-pay | Admitting: Obstetrics and Gynecology

## 2016-07-10 VITALS — BP 136/78 | HR 76 | Wt 299.2 lb

## 2016-07-10 DIAGNOSIS — Z1389 Encounter for screening for other disorder: Secondary | ICD-10-CM | POA: Diagnosis not present

## 2016-07-10 DIAGNOSIS — Z331 Pregnant state, incidental: Secondary | ICD-10-CM

## 2016-07-10 DIAGNOSIS — O98712 Human immunodeficiency virus [HIV] disease complicating pregnancy, second trimester: Secondary | ICD-10-CM

## 2016-07-10 DIAGNOSIS — O0992 Supervision of high risk pregnancy, unspecified, second trimester: Secondary | ICD-10-CM

## 2016-07-10 DIAGNOSIS — O0912 Supervision of pregnancy with history of ectopic or molar pregnancy, second trimester: Secondary | ICD-10-CM | POA: Diagnosis not present

## 2016-07-10 DIAGNOSIS — B20 Human immunodeficiency virus [HIV] disease: Secondary | ICD-10-CM

## 2016-07-10 DIAGNOSIS — Z3402 Encounter for supervision of normal first pregnancy, second trimester: Secondary | ICD-10-CM

## 2016-07-10 DIAGNOSIS — Z6841 Body Mass Index (BMI) 40.0 and over, adult: Secondary | ICD-10-CM | POA: Diagnosis not present

## 2016-07-10 LAB — POCT URINALYSIS DIPSTICK
Blood, UA: NEGATIVE
Glucose, UA: NEGATIVE
Ketones, UA: NEGATIVE
LEUKOCYTES UA: NEGATIVE
Nitrite, UA: NEGATIVE

## 2016-07-10 NOTE — Progress Notes (Signed)
Theresa Becker is a 30 y.o. female  Fetal Surveillance Testing today:  None   High Risk Pregnancy Diagnosis(es):   HIV+ on antivirals for 6 years, [facility], last visit in June 2017 Less 20 viral load in May 2017.   G2P1001 3843w2d Estimated Date of Delivery: 10/14/16  Blood pressure 136/78, pulse 76, weight 299 lb 3.2 oz (135.7 kg), last menstrual period 01/08/2016.  Urinalysis: Positive for trace protein   HPI: The patient is being seen today for ongoing management of for ongoing management of routine prenatal care. Pt was started on Labetalol during last visit for HTN.  Today she reports no new complaints.    BP weight and urine results all reviewed and noted. Patient reports good fetal movement, denies any bleeding and no rupture of membranes symptoms or regular contractions.  Fundal Height:  31cm Fetal Heart rate:  146 bpm   Patient is without complaints other than noted in her HPI. All questions were answered.  All lab and sonogram results have been reviewed. Comments: normal   Assessment:  1.  Pregnancy at 8343w2d,  Estimated Date of Delivery: 10/14/16                           2.  Chronic HTN, rule out pre-eclampsia                        3. Morbid obesity                         4. HIV + with undetectable viral load  Medication(s) Plans:  Continue Labetalol   Treatment Plan:  Growth US in 2 weeks. Follow [facility] for ID clinic appointment in 1 week.   No Follow-up on file. for appointment for high risk OB care  No orders of the defined types were placed in this encounter.  Orders Placed This Encounter  Procedures   POCT urinalysis dipstick   By signing my name below, I, Freida Busmaniana Omoyeni, attest that this documentation has been prepared under the direction and in the presence of Tilda BurrowJohn V Ferguson, MD . Electronically Signed: Freida Busmaniana Omoyeni, Scribe. 07/10/2016. 10:40 AM. I personally performed the services described in this documentation,  which was SCRIBED in my presence. The recorded information has been reviewed and considered accurate. It has been edited as necessary during review. Tilda BurrowFERGUSON,JOHN V, MD

## 2016-07-11 LAB — COMPREHENSIVE METABOLIC PANEL
ALBUMIN: 3.5 g/dL (ref 3.5–5.5)
ALT: 19 IU/L (ref 0–32)
AST: 17 IU/L (ref 0–40)
Albumin/Globulin Ratio: 1.2 (ref 1.2–2.2)
Alkaline Phosphatase: 73 IU/L (ref 39–117)
BUN / CREAT RATIO: 16 (ref 9–23)
BUN: 8 mg/dL (ref 6–20)
CO2: 22 mmol/L (ref 18–29)
Calcium: 9.4 mg/dL (ref 8.7–10.2)
Chloride: 102 mmol/L (ref 96–106)
Creatinine, Ser: 0.49 mg/dL — ABNORMAL LOW (ref 0.57–1.00)
GFR calc non Af Amer: 132 mL/min/{1.73_m2} (ref >59)
GFR, EST AFRICAN AMERICAN: 152 mL/min/{1.73_m2} (ref >59)
GLUCOSE: 107 mg/dL — AB (ref 65–99)
Globulin, Total: 2.9 g/dL (ref 1.5–4.5)
POTASSIUM: 4.2 mmol/L (ref 3.5–5.2)
Sodium: 140 mmol/L (ref 134–144)
TOTAL PROTEIN: 6.4 g/dL (ref 6.0–8.5)

## 2016-07-11 LAB — CBC
HEMATOCRIT: 33.4 % — AB (ref 34.0–46.6)
HEMOGLOBIN: 10.6 g/dL — AB (ref 11.1–15.9)
MCH: 27.1 pg (ref 26.6–33.0)
MCHC: 31.7 g/dL (ref 31.5–35.7)
MCV: 85 fL (ref 79–97)
Platelets: 208 10*3/uL (ref 150–379)
RBC: 3.91 x10E6/uL (ref 3.77–5.28)
RDW: 14.9 % (ref 12.3–15.4)
WBC: 7.9 10*3/uL (ref 3.4–10.8)

## 2016-07-11 LAB — PROTEIN, URINE, 24 HOUR
PROTEIN 24H UR: 195 mg/(24.h) — AB (ref 30–150)
Protein, Ur: 15 mg/dL

## 2016-07-21 ENCOUNTER — Other Ambulatory Visit: Payer: Self-pay | Admitting: Obstetrics and Gynecology

## 2016-07-21 DIAGNOSIS — Z0489 Encounter for examination and observation for other specified reasons: Secondary | ICD-10-CM

## 2016-07-21 DIAGNOSIS — IMO0002 Reserved for concepts with insufficient information to code with codable children: Secondary | ICD-10-CM

## 2016-07-21 DIAGNOSIS — Z3689 Encounter for other specified antenatal screening: Secondary | ICD-10-CM

## 2016-07-21 DIAGNOSIS — O162 Unspecified maternal hypertension, second trimester: Secondary | ICD-10-CM

## 2016-07-24 ENCOUNTER — Ambulatory Visit (INDEPENDENT_AMBULATORY_CARE_PROVIDER_SITE_OTHER): Payer: Medicaid Other

## 2016-07-24 ENCOUNTER — Ambulatory Visit (INDEPENDENT_AMBULATORY_CARE_PROVIDER_SITE_OTHER): Payer: Medicaid Other | Admitting: Obstetrics and Gynecology

## 2016-07-24 VITALS — BP 144/90 | HR 86 | Wt 296.8 lb

## 2016-07-24 DIAGNOSIS — O133 Gestational [pregnancy-induced] hypertension without significant proteinuria, third trimester: Secondary | ICD-10-CM

## 2016-07-24 DIAGNOSIS — Z1389 Encounter for screening for other disorder: Secondary | ICD-10-CM

## 2016-07-24 DIAGNOSIS — Z3A29 29 weeks gestation of pregnancy: Secondary | ICD-10-CM

## 2016-07-24 DIAGNOSIS — O09893 Supervision of other high risk pregnancies, third trimester: Secondary | ICD-10-CM | POA: Diagnosis not present

## 2016-07-24 DIAGNOSIS — O99213 Obesity complicating pregnancy, third trimester: Secondary | ICD-10-CM

## 2016-07-24 DIAGNOSIS — O162 Unspecified maternal hypertension, second trimester: Secondary | ICD-10-CM

## 2016-07-24 DIAGNOSIS — Z3689 Encounter for other specified antenatal screening: Secondary | ICD-10-CM

## 2016-07-24 DIAGNOSIS — Z3402 Encounter for supervision of normal first pregnancy, second trimester: Secondary | ICD-10-CM

## 2016-07-24 DIAGNOSIS — IMO0002 Reserved for concepts with insufficient information to code with codable children: Secondary | ICD-10-CM

## 2016-07-24 DIAGNOSIS — O0990 Supervision of high risk pregnancy, unspecified, unspecified trimester: Secondary | ICD-10-CM

## 2016-07-24 DIAGNOSIS — Z331 Pregnant state, incidental: Secondary | ICD-10-CM | POA: Diagnosis not present

## 2016-07-24 DIAGNOSIS — O98713 Human immunodeficiency virus [HIV] disease complicating pregnancy, third trimester: Secondary | ICD-10-CM

## 2016-07-24 DIAGNOSIS — Z0489 Encounter for examination and observation for other specified reasons: Secondary | ICD-10-CM

## 2016-07-24 LAB — POCT URINALYSIS DIPSTICK
GLUCOSE UA: NEGATIVE
Glucose, UA: NEGATIVE
Ketones, UA: NEGATIVE
Ketones, UA: NEGATIVE
Leukocytes, UA: NEGATIVE
NITRITE UA: NEGATIVE
Nitrite, UA: NEGATIVE
PROTEIN UA: NEGATIVE
RBC UA: NEGATIVE
RBC UA: NEGATIVE

## 2016-07-24 NOTE — Progress Notes (Signed)
US 28+2 wks,cephalic,cx 4.6 cm,ant pl gr 1,normal ov's bilat,fhr 133 bpm,afi 11.4 cm,efw 1428 g 72%,anatomy complete,no obvious abnormalities,limited ultrasound because of pt body habitus.

## 2016-07-24 NOTE — Progress Notes (Addendum)
Fetal Surveillance Testing today:   High Risk Pregnancy Diagnosis(es):   HIV+ on antivirals for 6 years, [facility], last visit 07/17/2016, viral load undetectable.  G2P1001 6267w2d Estimated Date of Delivery: 10/14/16  Blood pressure (!) 144/90, pulse 86, weight 296 lb 12.8 oz (134.6 kg), last menstrual period 01/08/2016.  Urinalysis: trace of protein, otherwise negative.   HPI: Pt reports that she was seen by her Infectious disease provider and informed that her hemoglobin was low and thus she began OTC prenatal vitamins. Pt reports that with her past visit at her Infectious Disease provider, her viral load is now undetectable but her CD4 level dropped from 920 to 520.  Pt denies any other symptoms.  BP weight and urine results all reviewed and noted. Patient reports good fetal movement, denies any bleeding and no rupture of membranes symptoms or regular contractions.  Fundal Height: Fetal Heart rate:  133 Edema:    Patient is without complaints. All questions were answered.  Assessment:  6767w2d, HIV+ on antivirals for 6 years, [facility], last visit 07/17/2016, viral load undetectable.  Medication(s) Plans: Iron supplement Rx. Increase labetalol from 200 mg to 400 mg BID  Treatment Plan:    Follow up in 2 weeks for OB appt, bpp  By signing my name below, I, Soijett Blue, attest that this documentation has been prepared under the direction and in the presence of Tilda BurrowJohn V Rich Paprocki, MD. Electronically Signed: Soijett Blue, ED Scribe. 07/24/16. 11:39 AM.  I personally performed the services described in this documentation, which was SCRIBED in my presence. The recorded information has been reviewed and considered accurate. It has been edited as necessary during review. Tilda BurrowFERGUSON,Lanard Arguijo V, MD

## 2016-07-27 ENCOUNTER — Telehealth: Payer: Self-pay | Admitting: Obstetrics and Gynecology

## 2016-07-27 MED ORDER — FUSION PLUS PO CAPS: 1.0000 | ORAL_CAPSULE | Freq: Every day | ORAL | 6 refills | Status: DC

## 2016-07-27 NOTE — Telephone Encounter (Signed)
Pt states Dr. Emelda FearFerguson was to e-scribed Iron supplement for Hgb of 10.6 on 07/10/2016 but Rx not at pharmacy. Please advise.

## 2016-07-29 ENCOUNTER — Telehealth: Payer: Self-pay | Admitting: Obstetrics and Gynecology

## 2016-07-29 NOTE — Telephone Encounter (Signed)
Pt informed [facility] is not covering meds that can be purchased OTC. Will need to get OTC iron supplement, may be cheaper. Pt verbalized understanding.

## 2016-07-30 ENCOUNTER — Other Ambulatory Visit: Payer: Self-pay | Admitting: Obstetrics and Gynecology

## 2016-07-30 DIAGNOSIS — O10919 Unspecified pre-existing hypertension complicating pregnancy, unspecified trimester: Secondary | ICD-10-CM

## 2016-08-04 ENCOUNTER — Other Ambulatory Visit: Payer: Self-pay | Admitting: Obstetrics and Gynecology

## 2016-08-04 DIAGNOSIS — O10919 Unspecified pre-existing hypertension complicating pregnancy, unspecified trimester: Secondary | ICD-10-CM

## 2016-08-07 ENCOUNTER — Encounter: Payer: Self-pay | Admitting: Obstetrics & Gynecology

## 2016-08-07 ENCOUNTER — Encounter: Payer: Medicaid Other | Admitting: Obstetrics & Gynecology

## 2016-08-07 ENCOUNTER — Ambulatory Visit (INDEPENDENT_AMBULATORY_CARE_PROVIDER_SITE_OTHER): Payer: Medicaid Other

## 2016-08-07 ENCOUNTER — Ambulatory Visit (INDEPENDENT_AMBULATORY_CARE_PROVIDER_SITE_OTHER): Payer: Medicaid Other | Admitting: Obstetrics & Gynecology

## 2016-08-07 VITALS — BP 120/80 | HR 80 | Wt 302.0 lb

## 2016-08-07 DIAGNOSIS — Z1389 Encounter for screening for other disorder: Secondary | ICD-10-CM | POA: Diagnosis not present

## 2016-08-07 DIAGNOSIS — O0990 Supervision of high risk pregnancy, unspecified, unspecified trimester: Secondary | ICD-10-CM

## 2016-08-07 DIAGNOSIS — O09893 Supervision of other high risk pregnancies, third trimester: Secondary | ICD-10-CM

## 2016-08-07 DIAGNOSIS — O10919 Unspecified pre-existing hypertension complicating pregnancy, unspecified trimester: Secondary | ICD-10-CM

## 2016-08-07 DIAGNOSIS — Z3402 Encounter for supervision of normal first pregnancy, second trimester: Secondary | ICD-10-CM

## 2016-08-07 DIAGNOSIS — O10913 Unspecified pre-existing hypertension complicating pregnancy, third trimester: Secondary | ICD-10-CM | POA: Diagnosis not present

## 2016-08-07 DIAGNOSIS — Z3A31 31 weeks gestation of pregnancy: Secondary | ICD-10-CM

## 2016-08-07 DIAGNOSIS — O99213 Obesity complicating pregnancy, third trimester: Secondary | ICD-10-CM

## 2016-08-07 DIAGNOSIS — Z23 Encounter for immunization: Secondary | ICD-10-CM

## 2016-08-07 DIAGNOSIS — O98713 Human immunodeficiency virus [HIV] disease complicating pregnancy, third trimester: Secondary | ICD-10-CM | POA: Diagnosis not present

## 2016-08-07 DIAGNOSIS — Z331 Pregnant state, incidental: Secondary | ICD-10-CM

## 2016-08-07 DIAGNOSIS — O10912 Unspecified pre-existing hypertension complicating pregnancy, second trimester: Secondary | ICD-10-CM

## 2016-08-07 DIAGNOSIS — O133 Gestational [pregnancy-induced] hypertension without significant proteinuria, third trimester: Secondary | ICD-10-CM

## 2016-08-07 DIAGNOSIS — B20 Human immunodeficiency virus [HIV] disease: Secondary | ICD-10-CM

## 2016-08-07 LAB — POCT URINALYSIS DIPSTICK
GLUCOSE UA: NEGATIVE
KETONES UA: NEGATIVE
Leukocytes, UA: NEGATIVE
Nitrite, UA: NEGATIVE
RBC UA: NEGATIVE

## 2016-08-07 NOTE — Progress Notes (Signed)
US 30+2 wks,trans head right,cx 5 cm,ant pl gr 1,afi 11.9 cm,fhr 158 bpm,bpp 8/8,RI .55,.51

## 2016-08-07 NOTE — Progress Notes (Signed)
Fetal Surveillance Testing today:  BPP 8/8 with excellent Doppler flow   High Risk Pregnancy Diagnosis(es):   CHTN,HIV+  G2P1001 1842w2d Estimated Date of Delivery: 10/14/16  Blood pressure 120/80, pulse 80, weight (!) 302 lb (137 kg), last menstrual period 01/08/2016.  Urinalysis: Negative   HPI: The patient is being seen today for ongoing management of as above. Today she reports no problems, will start taking at home 4 times daily   BP weight and urine results all reviewed and noted. Patient reports good fetal movement, denies any bleeding and no rupture of membranes symptoms or regular contractions.  Fundal Height:  37  Fetal Heart rate:  150 Edema:  none  Patient is without complaints other than noted in her HPI. All questions were answered.  All lab and sonogram results have been reviewed. Comments:    Assessment:  1.  Pregnancy at 3242w2d,  Estimated Date of Delivery: 10/14/16 :                          2.  CHTN                        3.  HIV+  Medication(s) Plans:  Labetalol 200 BID, baby ASA, truveda  Treatment Plan:  At 32 weeks begin twice weekly surveillance, sono alternating with NST, delivery 39 weeks  Return in about 2 weeks (around 08/21/2016) for NST, HROB. for appointment for high risk OB care  No orders of the defined types were placed in this encounter.  Orders Placed This Encounter  Procedures  . POCT urinalysis dipstick

## 2016-08-21 ENCOUNTER — Encounter: Payer: Self-pay | Admitting: Obstetrics & Gynecology

## 2016-08-21 ENCOUNTER — Ambulatory Visit (INDEPENDENT_AMBULATORY_CARE_PROVIDER_SITE_OTHER): Payer: Medicaid Other | Admitting: Obstetrics & Gynecology

## 2016-08-21 VITALS — BP 160/90 | HR 100 | Wt 302.0 lb

## 2016-08-21 DIAGNOSIS — O0993 Supervision of high risk pregnancy, unspecified, third trimester: Secondary | ICD-10-CM

## 2016-08-21 DIAGNOSIS — B20 Human immunodeficiency virus [HIV] disease: Secondary | ICD-10-CM

## 2016-08-21 DIAGNOSIS — Z331 Pregnant state, incidental: Secondary | ICD-10-CM

## 2016-08-21 DIAGNOSIS — O10913 Unspecified pre-existing hypertension complicating pregnancy, third trimester: Secondary | ICD-10-CM | POA: Diagnosis not present

## 2016-08-21 DIAGNOSIS — Z1389 Encounter for screening for other disorder: Secondary | ICD-10-CM

## 2016-08-21 DIAGNOSIS — Z21 Asymptomatic human immunodeficiency virus [HIV] infection status: Secondary | ICD-10-CM

## 2016-08-21 LAB — POCT URINALYSIS DIPSTICK
Glucose, UA: NEGATIVE
Ketones, UA: NEGATIVE
Leukocytes, UA: NEGATIVE
NITRITE UA: NEGATIVE
RBC UA: NEGATIVE

## 2016-08-21 MED ORDER — ALBUTEROL SULFATE HFA 108 (90 BASE) MCG/ACT IN AERS: 1.0000 | INHALATION_SPRAY | Freq: Four times a day (QID) | RESPIRATORY_TRACT | 11 refills | Status: DC | PRN

## 2016-08-21 NOTE — Progress Notes (Signed)
Fetal Surveillance Testing today:  Reactive NST   High Risk Pregnancy Diagnosis(es):   CHTN, HIV+  G2P1001 7483w2d Estimated Date of Delivery: 10/14/16  Blood pressure (!) 160/90, pulse 100, weight (!) 302 lb (137 kg), last menstrual period 01/08/2016.  Urinalysis: Negative   HPI: The patient is being seen today for ongoing management of as above. Today she reports ran of her BP meds three days ago, has not gotten refilled will go get it today!!!!   BP weight and urine results all reviewed and noted. Patient reports good fetal movement, denies any bleeding and no rupture of membranes symptoms or regular contractions.  Fundal Height:  40 Fetal Heart rate:  130 Edema:  trace Patient is without complaints other than noted in her HPI. All questions were answered.  All lab and sonogram results have been reviewed. Comments:    Assessment:  1.  Pregnancy at 6283w2d,  Estimated Date of Delivery: 10/14/16 :                          2.  Chronic hypertension                        3.  HIV+  Medication(s) Plans:  Labetalol 200 BID,   Treatment Plan:  Twice weekly surveillance, sonogram alternating with NST, induction at 39 weeks or as clinically indicated   Return in about 5 days (around 08/26/2016) for BPP/sono, HROB. for appointment for high risk OB care  Meds ordered this encounter  Medications  . albuterol (PROVENTIL HFA;VENTOLIN HFA) 108 (90 Base) MCG/ACT inhaler    Sig: Inhale 1-2 puffs into the lungs every 6 (six) hours as needed for wheezing or shortness of breath.    Dispense:  1 Inhaler    Refill:  11   Orders Placed This Encounter  Procedures  . US Fetal BPP W/O Non Stress  . US UA Cord Doppler  . POCT urinalysis dipstick

## 2016-08-26 ENCOUNTER — Ambulatory Visit (INDEPENDENT_AMBULATORY_CARE_PROVIDER_SITE_OTHER): Payer: Medicaid Other | Admitting: Obstetrics and Gynecology

## 2016-08-26 ENCOUNTER — Encounter: Payer: Self-pay | Admitting: Obstetrics and Gynecology

## 2016-08-26 ENCOUNTER — Ambulatory Visit (INDEPENDENT_AMBULATORY_CARE_PROVIDER_SITE_OTHER): Payer: Medicaid Other

## 2016-08-26 VITALS — BP 140/90 | HR 90 | Wt 303.2 lb

## 2016-08-26 DIAGNOSIS — B20 Human immunodeficiency virus [HIV] disease: Secondary | ICD-10-CM | POA: Diagnosis not present

## 2016-08-26 DIAGNOSIS — Z1389 Encounter for screening for other disorder: Secondary | ICD-10-CM | POA: Diagnosis not present

## 2016-08-26 DIAGNOSIS — O10913 Unspecified pre-existing hypertension complicating pregnancy, third trimester: Secondary | ICD-10-CM

## 2016-08-26 DIAGNOSIS — Z331 Pregnant state, incidental: Secondary | ICD-10-CM | POA: Diagnosis not present

## 2016-08-26 DIAGNOSIS — O99213 Obesity complicating pregnancy, third trimester: Secondary | ICD-10-CM | POA: Diagnosis not present

## 2016-08-26 DIAGNOSIS — Z3A33 33 weeks gestation of pregnancy: Secondary | ICD-10-CM

## 2016-08-26 DIAGNOSIS — O0993 Supervision of high risk pregnancy, unspecified, third trimester: Secondary | ICD-10-CM | POA: Diagnosis not present

## 2016-08-26 LAB — POCT URINALYSIS DIPSTICK
Glucose, UA: NEGATIVE
KETONES UA: NEGATIVE
Leukocytes, UA: NEGATIVE
Nitrite, UA: NEGATIVE
RBC UA: NEGATIVE

## 2016-08-26 NOTE — Progress Notes (Signed)
US 33 wks,cephalic,fhr 143 bpm,bilat adnexa's wnl,ant pl 2,RI .55,.62,AFI 15 cm,BPP 8/8,limited view because of pt body habitus

## 2016-08-26 NOTE — Progress Notes (Signed)
High Risk Pregnancy HROB Diagnosis(es):   CHTN, HIV+  G2P1001 5332w0d Estimated Date of Delivery: 10/14/16    HPI: The patient is being seen today for ongoing management of as above. Today she reports no complaints.  Patient reports good fetal movement, denies any bleeding and no rupture of membranes symptoms or regular contractions.   BP weight and urine results reviewed and noted. Blood pressure 140/90, pulse 90, weight (!) 303 lb 3.2 oz (137.5 kg), last menstrual period 01/08/2016.  Fundal Height:   Fetal Heart rate:  131 Physical Examination: Abdomen - normal                                     Pelvic - examination not indicated                                     Edema:  trace  Urinalysis:NEGATIVE except trace protein  Fetal Surveillance Testing today:  BPP and cord doppler  Lab and sonogram results have been reviewed. Comments:   Assessment:  1.  Pregnancy at 9232w0d,  G2P1001    Estimated Date of Delivery: 10/14/16                         2.  Chronic hypertension                        3.  HIV+  Medication(s) Plans:  Labetalol 200 BID  Treatment Plan:  Twice weekly surveillance, sonogram alternating with NST, induction at 39 weeks or as clinically indicated  Discussed need to maintain weight at or below 300 lb at time of delivery. Encouraged to be active throughout remainder of pregnancy as well as post partum.   Follow up in next week for appointment for high risk OB care  By signing my name below, I, Sonum Patel, attest that this documentation has been prepared under the direction and in the presence of Tilda BurrowJohn V Robyn Galati, MD. Electronically Signed: Sonum Patel, Neurosurgeoncribe. 08/26/16. 9:42 AM.  I personally performed the services described in this documentation, which was SCRIBED in my presence. The recorded information has been reviewed and considered accurate. It has been edited as necessary during review. Tilda BurrowFERGUSON,Akiya Morr V, MD

## 2016-08-31 ENCOUNTER — Other Ambulatory Visit: Payer: Self-pay | Admitting: Obstetrics and Gynecology

## 2016-08-31 DIAGNOSIS — O10919 Unspecified pre-existing hypertension complicating pregnancy, unspecified trimester: Secondary | ICD-10-CM

## 2016-08-31 DIAGNOSIS — O98713 Human immunodeficiency virus [HIV] disease complicating pregnancy, third trimester: Secondary | ICD-10-CM

## 2016-08-31 DIAGNOSIS — O99213 Obesity complicating pregnancy, third trimester: Secondary | ICD-10-CM

## 2016-09-01 ENCOUNTER — Ambulatory Visit (INDEPENDENT_AMBULATORY_CARE_PROVIDER_SITE_OTHER): Payer: Medicaid Other | Admitting: Obstetrics & Gynecology

## 2016-09-01 ENCOUNTER — Ambulatory Visit (INDEPENDENT_AMBULATORY_CARE_PROVIDER_SITE_OTHER): Payer: Medicaid Other

## 2016-09-01 VITALS — BP 136/88 | HR 70 | Wt 305.0 lb

## 2016-09-01 DIAGNOSIS — Z3A34 34 weeks gestation of pregnancy: Secondary | ICD-10-CM

## 2016-09-01 DIAGNOSIS — B20 Human immunodeficiency virus [HIV] disease: Secondary | ICD-10-CM | POA: Diagnosis not present

## 2016-09-01 DIAGNOSIS — Z331 Pregnant state, incidental: Secondary | ICD-10-CM

## 2016-09-01 DIAGNOSIS — O10913 Unspecified pre-existing hypertension complicating pregnancy, third trimester: Secondary | ICD-10-CM

## 2016-09-01 DIAGNOSIS — O99213 Obesity complicating pregnancy, third trimester: Secondary | ICD-10-CM | POA: Diagnosis not present

## 2016-09-01 DIAGNOSIS — O10919 Unspecified pre-existing hypertension complicating pregnancy, unspecified trimester: Secondary | ICD-10-CM

## 2016-09-01 DIAGNOSIS — O98713 Human immunodeficiency virus [HIV] disease complicating pregnancy, third trimester: Secondary | ICD-10-CM

## 2016-09-01 DIAGNOSIS — O0993 Supervision of high risk pregnancy, unspecified, third trimester: Secondary | ICD-10-CM

## 2016-09-01 DIAGNOSIS — Z1389 Encounter for screening for other disorder: Secondary | ICD-10-CM

## 2016-09-01 LAB — POCT URINALYSIS DIPSTICK
Blood, UA: NEGATIVE
GLUCOSE UA: NEGATIVE
Ketones, UA: NEGATIVE
Leukocytes, UA: NEGATIVE
Nitrite, UA: NEGATIVE

## 2016-09-01 NOTE — Progress Notes (Signed)
US 33+6 wks,cephalic,fhr 167 bpm,ant pl gr 2,afi 16 cm,RI .56,.52,EFW 2904 g 83%,BPP 8/8

## 2016-09-01 NOTE — Progress Notes (Signed)
Fetal Surveillance Testing today:  BPP 8/8 with excellent Doppler flow   High Risk Pregnancy Diagnosis(es):   CHTN, HIV+(undetectable load)  G2P1001 7339w6d Estimated Date of Delivery: 10/14/16  Blood pressure 136/88, pulse 70, weight (!) 305 lb (138.3 kg), last menstrual period 01/08/2016.  Urinalysis: Negative   HPI: The patient is being seen today for ongoing management of as above. Today she reports no problems   BP weight and urine results all reviewed and noted. Patient reports good fetal movement, denies any bleeding and no rupture of membranes symptoms or regular contractions.  Fundal Height:  na Fetal Heart rate:  167 Edema:  trace  Patient is without complaints other than noted in her HPI. All questions were answered.  All lab and sonogram results have been reviewed. Comments:    Assessment:  1.  Pregnancy at 1439w6d,  Estimated Date of Delivery: 10/14/16 :                          2.  CHTN                        3.  HIV+  Medication(s) Plans:  Labetalol 200 BID, truveda, ASA  Treatment Plan:  Twice weekly surveillance, sonogram alternating with NST, induction at 39 weeks or as clinically indicated   Return in about 3 days (around 09/04/2016) for NST, HROB. for appointment for high risk OB care  No orders of the defined types were placed in this encounter.  Orders Placed This Encounter  Procedures  . POCT urinalysis dipstick

## 2016-09-04 ENCOUNTER — Encounter: Payer: Self-pay | Admitting: Obstetrics & Gynecology

## 2016-09-04 ENCOUNTER — Ambulatory Visit (INDEPENDENT_AMBULATORY_CARE_PROVIDER_SITE_OTHER): Payer: Medicaid Other | Admitting: Obstetrics & Gynecology

## 2016-09-04 VITALS — BP 122/82 | HR 74 | Wt 302.0 lb

## 2016-09-04 DIAGNOSIS — O98713 Human immunodeficiency virus [HIV] disease complicating pregnancy, third trimester: Secondary | ICD-10-CM

## 2016-09-04 DIAGNOSIS — Z3A34 34 weeks gestation of pregnancy: Secondary | ICD-10-CM | POA: Diagnosis not present

## 2016-09-04 DIAGNOSIS — O99213 Obesity complicating pregnancy, third trimester: Secondary | ICD-10-CM

## 2016-09-04 DIAGNOSIS — B20 Human immunodeficiency virus [HIV] disease: Secondary | ICD-10-CM

## 2016-09-04 DIAGNOSIS — O10913 Unspecified pre-existing hypertension complicating pregnancy, third trimester: Secondary | ICD-10-CM

## 2016-09-04 DIAGNOSIS — Z1389 Encounter for screening for other disorder: Secondary | ICD-10-CM

## 2016-09-04 DIAGNOSIS — O0993 Supervision of high risk pregnancy, unspecified, third trimester: Secondary | ICD-10-CM

## 2016-09-04 DIAGNOSIS — Z331 Pregnant state, incidental: Secondary | ICD-10-CM | POA: Diagnosis not present

## 2016-09-04 LAB — POCT URINALYSIS DIPSTICK
Glucose, UA: NEGATIVE
KETONES UA: NEGATIVE
Leukocytes, UA: NEGATIVE
NITRITE UA: NEGATIVE
Protein, UA: NEGATIVE
RBC UA: NEGATIVE

## 2016-09-04 NOTE — Progress Notes (Signed)
Fetal Surveillance Testing today:  BPP 8/8 with excellent Doppler flow   High Risk Pregnancy Diagnosis(es):   CHTN, HIV+(undetectable load)  G2P1001 1321w6d Estimated Date of Delivery: 10/14/16  Blood pressure 122/82, pulse 74, weight (!) 302 lb (137 kg), last menstrual period 01/08/2016, unknown if currently breastfeeding.  Urinalysis: Negative   HPI: The patient is being seen today for ongoing management of as above. Today she reports no problems   BP weight and urine results all reviewed and noted. Patient reports good fetal movement, denies any bleeding and no rupture of membranes symptoms or regular contractions.  Fundal Height:  na Fetal Heart rate:  167 Edema:  trace  Patient is without complaints other than noted in her HPI. All questions were answered.  All lab and sonogram results have been reviewed. Comments:    Assessment:  1.  Pregnancy at 4247w6d Estimated Date of Delivery: 10/14/16 :                          2.  CHTN                        3.  HIV+  Medication(s) Plans:  Labetalol 200 BID, truveda, ASA  Treatment Plan:  Twice weekly surveillance, sonogram alternating with NST, induction at 39 weeks or as clinically indicated   Return in about 4 days (around 09/08/2016) for NST, HROB. for appointment for high risk OB care  No orders of the defined types were placed in this encounter.  Orders Placed This Encounter  Procedures  . POCT urinalysis dipstick

## 2016-09-08 ENCOUNTER — Other Ambulatory Visit: Payer: Medicaid Other | Admitting: Obstetrics & Gynecology

## 2016-09-10 ENCOUNTER — Other Ambulatory Visit: Payer: Medicaid Other | Admitting: Advanced Practice Midwife

## 2016-09-10 ENCOUNTER — Other Ambulatory Visit: Payer: Self-pay | Admitting: Obstetrics & Gynecology

## 2016-09-10 DIAGNOSIS — O10919 Unspecified pre-existing hypertension complicating pregnancy, unspecified trimester: Secondary | ICD-10-CM

## 2016-09-11 ENCOUNTER — Other Ambulatory Visit: Payer: Medicaid Other

## 2016-09-11 ENCOUNTER — Ambulatory Visit (INDEPENDENT_AMBULATORY_CARE_PROVIDER_SITE_OTHER): Payer: Medicaid Other

## 2016-09-11 ENCOUNTER — Encounter: Payer: Medicaid Other | Admitting: Obstetrics and Gynecology

## 2016-09-11 ENCOUNTER — Ambulatory Visit (INDEPENDENT_AMBULATORY_CARE_PROVIDER_SITE_OTHER): Payer: Medicaid Other | Admitting: Obstetrics and Gynecology

## 2016-09-11 ENCOUNTER — Encounter: Payer: Self-pay | Admitting: Obstetrics and Gynecology

## 2016-09-11 VITALS — BP 134/90 | HR 72 | Wt 302.0 lb

## 2016-09-11 DIAGNOSIS — O98713 Human immunodeficiency virus [HIV] disease complicating pregnancy, third trimester: Secondary | ICD-10-CM

## 2016-09-11 DIAGNOSIS — O99213 Obesity complicating pregnancy, third trimester: Secondary | ICD-10-CM

## 2016-09-11 DIAGNOSIS — O0993 Supervision of high risk pregnancy, unspecified, third trimester: Secondary | ICD-10-CM | POA: Diagnosis not present

## 2016-09-11 DIAGNOSIS — O10919 Unspecified pre-existing hypertension complicating pregnancy, unspecified trimester: Secondary | ICD-10-CM

## 2016-09-11 DIAGNOSIS — Z3A36 36 weeks gestation of pregnancy: Secondary | ICD-10-CM | POA: Diagnosis not present

## 2016-09-11 DIAGNOSIS — Z1389 Encounter for screening for other disorder: Secondary | ICD-10-CM | POA: Diagnosis not present

## 2016-09-11 DIAGNOSIS — O10913 Unspecified pre-existing hypertension complicating pregnancy, third trimester: Secondary | ICD-10-CM | POA: Diagnosis not present

## 2016-09-11 DIAGNOSIS — Z331 Pregnant state, incidental: Secondary | ICD-10-CM | POA: Diagnosis not present

## 2016-09-11 LAB — POCT URINALYSIS DIPSTICK
Blood, UA: NEGATIVE
Glucose, UA: NEGATIVE
Ketones, UA: NEGATIVE
LEUKOCYTES UA: NEGATIVE
NITRITE UA: NEGATIVE
PROTEIN UA: NEGATIVE

## 2016-09-11 NOTE — Progress Notes (Signed)
Patient ID: Theresa Becker, female   DOB: 19-Nov-1985, 30 y.o.   MRN: [id number] Fetal Surveillance Testing today: US BPP   High Risk Pregnancy Diagnosis(es):  CHTN, HIV+(undetectable load)  G2P1001 6138w2d Estimated Date of Delivery: 10/14/16  Blood pressure (!) 144/90, pulse 72, weight (!) 302 lb (137 kg), last menstrual period 01/08/2016.  Urinalysis: Negative   HPI: The patient is being seen today for ongoing management of CHTN, HIV+(undetectable load). Pt states that she has cut out salt and meat from her diet recently to improve her HTN. Pt denies any other symptoms. Pt reports that her last HIV viral load was tested on 07/17/16 and was undetectable. Pt states that her next appointment with her Infectious Disease provider is 09/17/2016.   BP weight and urine results all reviewed and noted. Patient reports good fetal movement, denies any bleeding and no rupture of membranes symptoms or regular contractions.  Fundal Height:  41 cm Fetal Heart rate:  145 per US Edema:   Patient is without complaints other than noted in her HPI. All questions were answered.  All lab and sonogram results have been reviewed. Comments: negative  Assessment:  1.  Pregnancy at 5938w2d,  Estimated Date of Delivery: 10/14/16                        2.  CHTN                        3.  HIV+ (undetectable load)  Medication(s) Plans: INCREASE to  200 mg labetalol TID  Treatment Plan:  Follow up for NST in 4 days.  No Follow-up on file. for appointment for high risk OB care  No orders of the defined types were placed in this encounter.  Orders Placed This Encounter  Procedures  . POCT urinalysis dipstick   By signing my name below, I, Theresa Becker, attest that this documentation has been prepared under the direction and in the presence of Theresa BurrowJohn Becker Rosenda Geffrard, MD. Electronically Signed: Soijett Becker, ED Scribe. 09/11/16. 11:24 AM.   I personally performed the services described in this documentation, which was  SCRIBED in my presence. The recorded information has been reviewed and considered accurate. It has been edited as necessary during review. Theresa Becker,Theresa Judy V, MD

## 2016-09-11 NOTE — Progress Notes (Signed)
US 35+2 wks,cephalic,ant pl gr 2,bilat adnexa's wnl,afi 14.1 cm,fhr 145 bpm,RI .53,.50,BPP 8/8

## 2016-09-14 ENCOUNTER — Encounter: Payer: Self-pay | Admitting: Obstetrics & Gynecology

## 2016-09-14 ENCOUNTER — Telehealth: Payer: Self-pay | Admitting: Obstetrics and Gynecology

## 2016-09-14 NOTE — Telephone Encounter (Signed)
Pt called stating that she would like for a nurse to write a note to [facility] stating that she needs to come to her appointment on Wed. For an NST. Pt needs it faxed to [phone] before 5pm today. Please contact pt

## 2016-09-14 NOTE — Telephone Encounter (Signed)
Pt informed that note was sent to [facility] for Wednesdays appointment.

## 2016-09-15 ENCOUNTER — Other Ambulatory Visit: Payer: Medicaid Other | Admitting: Obstetrics & Gynecology

## 2016-09-16 ENCOUNTER — Other Ambulatory Visit: Payer: Medicaid Other | Admitting: Obstetrics and Gynecology

## 2016-09-17 ENCOUNTER — Other Ambulatory Visit: Payer: Self-pay | Admitting: Obstetrics & Gynecology

## 2016-09-17 DIAGNOSIS — O10919 Unspecified pre-existing hypertension complicating pregnancy, unspecified trimester: Secondary | ICD-10-CM

## 2016-09-18 ENCOUNTER — Encounter: Payer: Self-pay | Admitting: Women's Health

## 2016-09-18 ENCOUNTER — Ambulatory Visit (INDEPENDENT_AMBULATORY_CARE_PROVIDER_SITE_OTHER): Payer: Medicaid Other | Admitting: Women's Health

## 2016-09-18 ENCOUNTER — Ambulatory Visit (INDEPENDENT_AMBULATORY_CARE_PROVIDER_SITE_OTHER): Payer: Medicaid Other

## 2016-09-18 ENCOUNTER — Encounter: Payer: Medicaid Other | Admitting: Women's Health

## 2016-09-18 VITALS — BP 134/78 | HR 72 | Wt 302.0 lb

## 2016-09-18 DIAGNOSIS — Z1389 Encounter for screening for other disorder: Secondary | ICD-10-CM | POA: Diagnosis not present

## 2016-09-18 DIAGNOSIS — O0993 Supervision of high risk pregnancy, unspecified, third trimester: Secondary | ICD-10-CM | POA: Diagnosis not present

## 2016-09-18 DIAGNOSIS — B20 Human immunodeficiency virus [HIV] disease: Secondary | ICD-10-CM

## 2016-09-18 DIAGNOSIS — O99213 Obesity complicating pregnancy, third trimester: Secondary | ICD-10-CM

## 2016-09-18 DIAGNOSIS — O10913 Unspecified pre-existing hypertension complicating pregnancy, third trimester: Secondary | ICD-10-CM

## 2016-09-18 DIAGNOSIS — Z3A37 37 weeks gestation of pregnancy: Secondary | ICD-10-CM

## 2016-09-18 DIAGNOSIS — Z331 Pregnant state, incidental: Secondary | ICD-10-CM | POA: Diagnosis not present

## 2016-09-18 DIAGNOSIS — Z21 Asymptomatic human immunodeficiency virus [HIV] infection status: Secondary | ICD-10-CM

## 2016-09-18 DIAGNOSIS — O10919 Unspecified pre-existing hypertension complicating pregnancy, unspecified trimester: Secondary | ICD-10-CM

## 2016-09-18 DIAGNOSIS — O98713 Human immunodeficiency virus [HIV] disease complicating pregnancy, third trimester: Secondary | ICD-10-CM | POA: Diagnosis not present

## 2016-09-18 DIAGNOSIS — O0990 Supervision of high risk pregnancy, unspecified, unspecified trimester: Secondary | ICD-10-CM

## 2016-09-18 LAB — POCT URINALYSIS DIPSTICK
Blood, UA: NEGATIVE
GLUCOSE UA: NEGATIVE
Ketones, UA: NEGATIVE
LEUKOCYTES UA: NEGATIVE
NITRITE UA: NEGATIVE

## 2016-09-18 NOTE — Patient Instructions (Addendum)
You will have your sugar test next visit.  Please do not eat or drink anything after midnight the night before you come, not even water.  Do take your blood pressure medicine with a small sip of water   Call the office [phone]) or go to [facility] if:  You begin to have strong, frequent contractions  Your water breaks.  Sometimes it is a big gush of fluid, sometimes it is just a trickle that keeps getting your panties wet or running down your legs  You have vaginal bleeding.  It is normal to have a small amount of spotting if your cervix was checked.   You don't feel your baby moving like normal.  If you don't, get you something to eat and drink and lay down and focus on feeling your baby move.  You should feel at least 10 movements in 2 hours.  If you don't, you should call the office or go to [facility].    Tdap Vaccine  It is recommended that you get the Tdap vaccine during the third trimester of EACH pregnancy to help protect your baby from getting pertussis (whooping cough)  27-36 weeks is the BEST time to do this so that you can pass the protection on to your baby. During pregnancy is better than after pregnancy, but if you are unable to get it during pregnancy it will be offered at the hospital.   You can get this vaccine at the health department or your family doctor  Everyone who will be around your baby should also be up-to-date on their vaccines. Adults (who are not pregnant) only need 1 dose of Tdap during adulthood.      Braxton Hicks Contractions Contractions of the uterus can occur throughout pregnancy. Contractions are not always a sign that you are in labor.  WHAT ARE BRAXTON HICKS CONTRACTIONS?  Contractions that occur before labor are called Braxton Hicks contractions, or false labor. Toward the end of pregnancy (32-34 weeks), these contractions can develop more often and may become more forceful. This is not true labor because these contractions do not  result in opening (dilatation) and thinning of the cervix. They are sometimes difficult to tell apart from true labor because these contractions can be forceful and people have different pain tolerances. You should not feel embarrassed if you go to the hospital with false labor. Sometimes, the only way to tell if you are in true labor is for your health care provider to look for changes in the cervix. If there are no prenatal problems or other health problems associated with the pregnancy, it is completely safe to be sent home with false labor and await the onset of true labor. HOW CAN YOU TELL THE DIFFERENCE BETWEEN TRUE AND FALSE LABOR? False Labor  The contractions of false labor are usually shorter and not as hard as those of true labor.   The contractions are usually irregular.   The contractions are often felt in the front of the lower abdomen and in the groin.   The contractions may go away when you walk around or change positions while lying down.   The contractions get weaker and are shorter lasting as time goes on.   The contractions do not usually become progressively stronger, regular, and closer together as with true labor.  True Labor  Contractions in true labor last 30-70 seconds, become very regular, usually become more intense, and increase in frequency.   The contractions do not go away with  walking.   The discomfort is usually felt in the top of the uterus and spreads to the lower abdomen and low back.   True labor can be determined by your health care provider with an exam. This will show that the cervix is dilating and getting thinner.  WHAT TO REMEMBER  Keep up with your usual exercises and follow other instructions given by your health care provider.   Take medicines as directed by your health care provider.   Keep your regular prenatal appointments.   Eat and drink lightly if you think you are going into labor.   If Braxton Hicks contractions  are making you uncomfortable:   Change your position from lying down or resting to walking, or from walking to resting.   Sit and rest in a tub of warm water.   Drink 2-3 glasses of water. Dehydration may cause these contractions.   Do slow and deep breathing several times an hour.  WHEN SHOULD I SEEK IMMEDIATE MEDICAL CARE? Seek immediate medical care if:  Your contractions become stronger, more regular, and closer together.   You have fluid leaking or gushing from your vagina.   You have a fever.   You pass blood-tinged mucus.   You have vaginal bleeding.   You have continuous abdominal pain.   You have low back pain that you never had before.   You feel your baby's head pushing down and causing pelvic pressure.   Your baby is not moving as much as it used to.    This information is not intended to replace advice given to you by your health care provider. Make sure you discuss any questions you have with your health care provider.   Document Released: 11/02/2005 Document Revised: 11/07/2013 Document Reviewed: 08/14/2013 Elsevier Interactive Patient Education [employer]2016 [employer] Inc.

## 2016-09-18 NOTE — Progress Notes (Signed)
High Risk Pregnancy Diagnosis(es): CHTN, HIV+ G2P1001 56w2dEstimated Date of Delivery: 10/14/16 BP 134/78   Pulse 72   Wt (!) 302 lb (137 kg)   LMP 01/08/2016   BMI 55.24 kg/m   Urinalysis: Positive for tr protein HPI:  Doing well[facility] had 'full panel' bloodwork drawn, last viral load few months ago was neg. Per care everywhere, HIV RNA Quant: 'target not dectected' BP, weight, and urine reviewed.  Reports good fm. Denies regular uc's, lof, vb, uti s/s. No complaints.  Fundal Height:  42 Fetal Heart rate:  150 u/s Edema: none  Reviewed today's u/s: vtx, afi 14.5cm, bpp 8/8, Dopp .53 & .52, EFW 57% Discussed ptl s/s, fkc All questions were answered Assessment: 344w2dHTN, HIV+ Medication(s) Plans:  Continue labetalol 20035mID, baby ASA, truvada and intelence Treatment Plan:  Growth u/s @  38-39wks     2x/wk testing nst/sono   Deliver @ 39wks  Follow up on Tues for high-risk OB appt and NST and 1hr glucola (never had PN2)

## 2016-09-18 NOTE — Progress Notes (Signed)
US 36+2 wks,cephalic,ant pl gr 2,afi 14.5 cm,fhr 150 bpm,BPP 8/8,bilat adnexa's wnl,RI .53,.52,EFW 3001 g 57%

## 2016-09-22 ENCOUNTER — Encounter: Payer: Self-pay | Admitting: Obstetrics & Gynecology

## 2016-09-22 ENCOUNTER — Ambulatory Visit (INDEPENDENT_AMBULATORY_CARE_PROVIDER_SITE_OTHER): Payer: Medicaid Other | Admitting: Obstetrics & Gynecology

## 2016-09-22 VITALS — Wt 302.0 lb

## 2016-09-22 DIAGNOSIS — Z3483 Encounter for supervision of other normal pregnancy, third trimester: Secondary | ICD-10-CM

## 2016-09-22 DIAGNOSIS — O10913 Unspecified pre-existing hypertension complicating pregnancy, third trimester: Secondary | ICD-10-CM

## 2016-09-22 DIAGNOSIS — B20 Human immunodeficiency virus [HIV] disease: Secondary | ICD-10-CM

## 2016-09-22 DIAGNOSIS — O98713 Human immunodeficiency virus [HIV] disease complicating pregnancy, third trimester: Secondary | ICD-10-CM | POA: Diagnosis not present

## 2016-09-22 DIAGNOSIS — O99213 Obesity complicating pregnancy, third trimester: Secondary | ICD-10-CM | POA: Diagnosis not present

## 2016-09-22 DIAGNOSIS — Z118 Encounter for screening for other infectious and parasitic diseases: Secondary | ICD-10-CM

## 2016-09-22 DIAGNOSIS — O0993 Supervision of high risk pregnancy, unspecified, third trimester: Secondary | ICD-10-CM | POA: Diagnosis not present

## 2016-09-22 DIAGNOSIS — Z131 Encounter for screening for diabetes mellitus: Secondary | ICD-10-CM

## 2016-09-22 DIAGNOSIS — Z3A37 37 weeks gestation of pregnancy: Secondary | ICD-10-CM | POA: Diagnosis not present

## 2016-09-22 DIAGNOSIS — O0990 Supervision of high risk pregnancy, unspecified, unspecified trimester: Secondary | ICD-10-CM

## 2016-09-22 DIAGNOSIS — Z369 Encounter for antenatal screening, unspecified: Secondary | ICD-10-CM

## 2016-09-22 DIAGNOSIS — Z3685 Encounter for antenatal screening for Streptococcus B: Secondary | ICD-10-CM

## 2016-09-22 DIAGNOSIS — Z1159 Encounter for screening for other viral diseases: Secondary | ICD-10-CM

## 2016-09-22 DIAGNOSIS — Z21 Asymptomatic human immunodeficiency virus [HIV] infection status: Secondary | ICD-10-CM

## 2016-09-22 NOTE — Treatment Plan (Signed)
   Induction Assessment Scheduling Form: [facility]ax to [facility]:  [phone]  Theresa ShaggySheena C Becker                                                                                   DOB:  04/07/1986                                                            MRN:  [id number]                                                                     Phone #:   [phone]                         Provider:  [facility]  GP:  G2P1001                                                            Estimated Date of Delivery: 10/14/16  Dating Criteria: LMP, 12 week sonogram    Medical Indications for induction:  Chronic Hypertension on labetalol, +HIV with undetectable viral load Admission Date/Time:  10/09/16@0630  Gestational age on admission:  3251w2d   Filed Weights   09/22/16 0934  Weight: (!) 302 lb (137 kg)   HIV: positive with undetectable viral load   GBS: pending    Cervical Exam pending   Method of induction(proposed):  cytotec   Scheduling Provider Signature:  Lazaro ArmsEURE,[name], MD                                            Today's Date:  09/22/2016   Scheduled with Magda PaganiniAudrey 09/22/2016 11:13 AM

## 2016-09-22 NOTE — Progress Notes (Signed)
     Fetal Surveillance Testing today:  Reactive NST   High Risk Pregnancy Diagnosis(es):   CHTN, +HIV undetectable viral load  G2P1001 6542w6d Estimated Date of Delivery: 10/14/16  Weight (!) 302 lb (137 kg), last menstrual period 01/08/2016.  Urinalysis: Negative   HPI: The patient is being seen today for ongoing management of as above. Today she reports no problems   BP weight and urine results all reviewed and noted. Patient reports good fetal movement, denies any bleeding and no rupture of membranes symptoms or regular contractions.  Fundal Height:  38 Fetal Heart rate:  135 Edema:  none  Patient is without complaints other than noted in her HPI. All questions were answered.  All lab and sonogram results have been reviewed. Comments:    Assessment:  1.  Pregnancy at 442w6d,  Estimated Date of Delivery: 10/14/16 :                          2.  CHTN                        3.  HIV+, undetectable load  Medication(s) Plans:  Labetalol 200 BID, baby ASA  Treatment Plan:  Twice weekly surveillance, sonogram alternating with NST, induction at 39 weeks or as clinically indicated, scheduled for 7877w2d(day after Thanksgiving)   Return in about 3 days (around 09/25/2016) for BPP/sono, HROB. for appointment for high risk OB care  No orders of the defined types were placed in this encounter.  Orders Placed This Encounter  Procedures  . GC/Chlamydia Probe Amp  . Strep Gp B NAA+Rflx  . US UA Cord Doppler  . US Fetal BPP W/O Non Stress  . CBC  . RPR  . HIV antibody  . Glucose Tolerance, 1 Hour  . Antibody screen

## 2016-09-24 LAB — CBC
HEMATOCRIT: 32.7 % — AB (ref 34.0–46.6)
HEMOGLOBIN: 10.7 g/dL — AB (ref 11.1–15.9)
MCH: 26.5 pg — AB (ref 26.6–33.0)
MCHC: 32.7 g/dL (ref 31.5–35.7)
MCV: 81 fL (ref 79–97)
Platelets: 196 10*3/uL (ref 150–379)
RBC: 4.04 x10E6/uL (ref 3.77–5.28)
RDW: 13.8 % (ref 12.3–15.4)
WBC: 6.2 10*3/uL (ref 3.4–10.8)

## 2016-09-24 LAB — GC/CHLAMYDIA PROBE AMP
CHLAMYDIA, DNA PROBE: NEGATIVE
NEISSERIA GONORRHOEAE BY PCR: NEGATIVE

## 2016-09-24 LAB — HIV ANTIBODY (ROUTINE TESTING W REFLEX)

## 2016-09-24 LAB — HIV 1/2 AB DIFFERENTIATION
HIV 1 Ab: POSITIVE — AB
HIV 2 Ab: NEGATIVE

## 2016-09-24 LAB — RPR: RPR Ser Ql: NONREACTIVE

## 2016-09-24 LAB — ANTIBODY SCREEN: ANTIBODY SCREEN: NEGATIVE

## 2016-09-24 LAB — GLUCOSE TOLERANCE, 1 HOUR: GLUCOSE, 1HR PP: 150 mg/dL (ref 65–199)

## 2016-09-25 ENCOUNTER — Telehealth (HOSPITAL_COMMUNITY): Payer: Self-pay | Admitting: *Deleted

## 2016-09-25 ENCOUNTER — Encounter: Payer: Self-pay | Admitting: Obstetrics & Gynecology

## 2016-09-25 ENCOUNTER — Ambulatory Visit (INDEPENDENT_AMBULATORY_CARE_PROVIDER_SITE_OTHER): Payer: Medicaid Other

## 2016-09-25 ENCOUNTER — Ambulatory Visit (INDEPENDENT_AMBULATORY_CARE_PROVIDER_SITE_OTHER): Payer: Medicaid Other | Admitting: Obstetrics & Gynecology

## 2016-09-25 VITALS — BP 140/90 | HR 76 | Wt 299.4 lb

## 2016-09-25 DIAGNOSIS — O98713 Human immunodeficiency virus [HIV] disease complicating pregnancy, third trimester: Secondary | ICD-10-CM

## 2016-09-25 DIAGNOSIS — O0993 Supervision of high risk pregnancy, unspecified, third trimester: Secondary | ICD-10-CM

## 2016-09-25 DIAGNOSIS — Z331 Pregnant state, incidental: Secondary | ICD-10-CM

## 2016-09-25 DIAGNOSIS — O99213 Obesity complicating pregnancy, third trimester: Secondary | ICD-10-CM | POA: Diagnosis not present

## 2016-09-25 DIAGNOSIS — Z3A37 37 weeks gestation of pregnancy: Secondary | ICD-10-CM | POA: Diagnosis not present

## 2016-09-25 DIAGNOSIS — O10913 Unspecified pre-existing hypertension complicating pregnancy, third trimester: Secondary | ICD-10-CM | POA: Diagnosis not present

## 2016-09-25 DIAGNOSIS — Z21 Asymptomatic human immunodeficiency virus [HIV] infection status: Secondary | ICD-10-CM

## 2016-09-25 DIAGNOSIS — Z1389 Encounter for screening for other disorder: Secondary | ICD-10-CM

## 2016-09-25 LAB — POCT URINALYSIS DIPSTICK
GLUCOSE UA: NEGATIVE
Ketones, UA: NEGATIVE
LEUKOCYTES UA: NEGATIVE
NITRITE UA: NEGATIVE
RBC UA: NEGATIVE

## 2016-09-25 NOTE — Progress Notes (Signed)
US 37+2 wks, cephalic presentation, BPP 8/8, AFI 14.96, FHR 136bpm, RI 0.52 and 0.51, S/D 2.05 and 2.06

## 2016-09-25 NOTE — Progress Notes (Signed)
Fetal Surveillance Testing today:  BPP 8/8, Doppler good  High Risk Pregnancy Diagnosis(es):   CHTN +HIV  G2P1001 3856w2d Estimated Date of Delivery: 10/14/16  Blood pressure 140/90, pulse 76, weight 299 lb 6.4 oz (135.8 kg), last menstrual period 01/08/2016.  Urinalysis: Negative   HPI: The patient is being seen today for ongoing management of as above. Today she reports no problems   BP weight and urine results all reviewed and noted. Patient reports good fetal movement, denies any bleeding and no rupture of membranes symptoms or regular contractions.  Fundal Height:  40 Fetal Heart rate:  136 Edema:  none  Patient is without complaints other than noted in her HPI. All questions were answered.  All lab and sonogram results have been reviewed. Comments:    Assessment:  1.  Pregnancy at 4056w2d,  Estimated Date of Delivery: 10/14/16 :                          2.  CHTN                        3.  HIV+  Medication(s) Plans:  Labetalol 200 BID  Treatment Plan:  Twice weekly surveillance, induction 11/24@[redacted]w[redacted]d   Return in about 4 days (around 09/29/2016) for NST, HROB, with Dr Despina HiddenEure. for appointment for high risk OB care  No orders of the defined types were placed in this encounter.  Orders Placed This Encounter  Procedures  . POCT urinalysis dipstick

## 2016-09-25 NOTE — Telephone Encounter (Signed)
Preadmission screen  

## 2016-09-27 LAB — STREP GP B SUSCEPTIBILITY

## 2016-09-27 LAB — OB RESULTS CONSOLE GBS: STREP GROUP B AG: POSITIVE

## 2016-09-27 LAB — STREP GP B NAA+RFLX: Strep Gp B NAA+Rflx: POSITIVE — AB

## 2016-09-29 ENCOUNTER — Ambulatory Visit (INDEPENDENT_AMBULATORY_CARE_PROVIDER_SITE_OTHER): Payer: Medicaid Other | Admitting: Obstetrics & Gynecology

## 2016-09-29 VITALS — BP 140/82 | HR 60 | Wt 299.8 lb

## 2016-09-29 DIAGNOSIS — Z1389 Encounter for screening for other disorder: Secondary | ICD-10-CM | POA: Diagnosis not present

## 2016-09-29 DIAGNOSIS — Z331 Pregnant state, incidental: Secondary | ICD-10-CM

## 2016-09-29 DIAGNOSIS — O10913 Unspecified pre-existing hypertension complicating pregnancy, third trimester: Secondary | ICD-10-CM

## 2016-09-29 DIAGNOSIS — B20 Human immunodeficiency virus [HIV] disease: Secondary | ICD-10-CM

## 2016-09-29 DIAGNOSIS — O0993 Supervision of high risk pregnancy, unspecified, third trimester: Secondary | ICD-10-CM

## 2016-09-29 DIAGNOSIS — O98713 Human immunodeficiency virus [HIV] disease complicating pregnancy, third trimester: Secondary | ICD-10-CM | POA: Diagnosis not present

## 2016-09-29 DIAGNOSIS — Z3A38 38 weeks gestation of pregnancy: Secondary | ICD-10-CM | POA: Diagnosis not present

## 2016-09-29 DIAGNOSIS — O10912 Unspecified pre-existing hypertension complicating pregnancy, second trimester: Secondary | ICD-10-CM

## 2016-09-29 LAB — POCT URINALYSIS DIPSTICK
Glucose, UA: NEGATIVE
Ketones, UA: NEGATIVE
Leukocytes, UA: NEGATIVE
Nitrite, UA: NEGATIVE

## 2016-09-29 NOTE — Progress Notes (Signed)
Fetal Surveillance Testing today:  Reactive NST   High Risk Pregnancy Diagnosis(es):   CHTN, +HIV(no detectable viral load)  G2P1001 8050w6d Estimated Date of Delivery: 10/14/16  Blood pressure 140/82, pulse 60, weight 299 lb 12.8 oz (136 kg), last menstrual period 01/08/2016.  Urinalysis: Negative   HPI: The patient is being seen today for ongoing management of as above. Today she reports no problems   BP weight and urine results all reviewed and noted. Patient reports good fetal movement, denies any bleeding and no rupture of membranes symptoms or regular contractions.  Fundal Height:  40 Fetal Heart rate:  Edema:  135  Patient is without complaints other than noted in her HPI. All questions were answered.  All lab and sonogram results have been reviewed. Comments:    Assessment:  1.  Pregnancy at 8450w6d,  Estimated Date of Delivery: 10/14/16 :                          2.  CHTN                        3.  HIV+, no detectable load  Medication(s) Plans:  Labetalol 200 BID, baby ASA  Treatment Plan:  Twice weekly surveillance induction 39 weeks, scheduled  Return in about 3 days (around 10/02/2016) for BPP/sono, HROB, Follow up, with Dr Despina HiddenEure. for appointment for high risk OB care  No orders of the defined types were placed in this encounter.  Orders Placed This Encounter  Procedures  . US Fetal BPP W/O Non Stress  . US UA Cord Doppler  . POCT urinalysis dipstick

## 2016-10-01 ENCOUNTER — Other Ambulatory Visit: Payer: Self-pay | Admitting: Obstetrics & Gynecology

## 2016-10-01 ENCOUNTER — Encounter (HOSPITAL_COMMUNITY): Payer: Self-pay | Admitting: *Deleted

## 2016-10-01 DIAGNOSIS — B20 Human immunodeficiency virus [HIV] disease: Secondary | ICD-10-CM

## 2016-10-01 DIAGNOSIS — O10912 Unspecified pre-existing hypertension complicating pregnancy, second trimester: Secondary | ICD-10-CM

## 2016-10-02 ENCOUNTER — Ambulatory Visit (INDEPENDENT_AMBULATORY_CARE_PROVIDER_SITE_OTHER): Payer: Medicaid Other | Admitting: Obstetrics & Gynecology

## 2016-10-02 ENCOUNTER — Ambulatory Visit (INDEPENDENT_AMBULATORY_CARE_PROVIDER_SITE_OTHER): Payer: Medicaid Other

## 2016-10-02 ENCOUNTER — Encounter: Payer: Self-pay | Admitting: Obstetrics & Gynecology

## 2016-10-02 VITALS — BP 140/90 | HR 78 | Wt 299.0 lb

## 2016-10-02 DIAGNOSIS — O0993 Supervision of high risk pregnancy, unspecified, third trimester: Secondary | ICD-10-CM | POA: Diagnosis not present

## 2016-10-02 DIAGNOSIS — Z3A38 38 weeks gestation of pregnancy: Secondary | ICD-10-CM

## 2016-10-02 DIAGNOSIS — Z1389 Encounter for screening for other disorder: Secondary | ICD-10-CM | POA: Diagnosis not present

## 2016-10-02 DIAGNOSIS — B20 Human immunodeficiency virus [HIV] disease: Secondary | ICD-10-CM

## 2016-10-02 DIAGNOSIS — Z3A39 39 weeks gestation of pregnancy: Secondary | ICD-10-CM

## 2016-10-02 DIAGNOSIS — O98713 Human immunodeficiency virus [HIV] disease complicating pregnancy, third trimester: Secondary | ICD-10-CM | POA: Diagnosis not present

## 2016-10-02 DIAGNOSIS — O10913 Unspecified pre-existing hypertension complicating pregnancy, third trimester: Secondary | ICD-10-CM

## 2016-10-02 DIAGNOSIS — Z331 Pregnant state, incidental: Secondary | ICD-10-CM

## 2016-10-02 DIAGNOSIS — O10912 Unspecified pre-existing hypertension complicating pregnancy, second trimester: Secondary | ICD-10-CM

## 2016-10-02 DIAGNOSIS — O0990 Supervision of high risk pregnancy, unspecified, unspecified trimester: Secondary | ICD-10-CM

## 2016-10-02 LAB — POCT URINALYSIS DIPSTICK
GLUCOSE UA: NEGATIVE
KETONES UA: NEGATIVE
Leukocytes, UA: NEGATIVE
Nitrite, UA: NEGATIVE
Protein, UA: NEGATIVE

## 2016-10-02 NOTE — Progress Notes (Signed)
US 38+2 wks,cephalic,ant pl gr 2,fhr 127 bpm,afi 9.5 cm,RI .50,.62,EFW 3720 g 82%,BPP 8/8

## 2016-10-02 NOTE — Progress Notes (Signed)
Fetal Surveillance Testing today:  BPP 8/8 Doppler flow is normal   High Risk Pregnancy Diagnosis(es):   CHTN, +HIV undetectable load  G2P1001 6363w2d Estimated Date of Delivery: 10/14/16  Blood pressure 140/90, pulse 78, weight 299 lb (135.6 kg), last menstrual period 01/08/2016.  Urinalysis: Negative   HPI: The patient is being seen today for ongoing management of as above. Today she reports no problems   BP weight and urine results all reviewed and noted. Patient reports good fetal movement, denies any bleeding and no rupture of membranes symptoms or regular contractions.  Fundal Height:  42 Fetal Heart rate:  143 Edema:  none  Patient is without complaints other than noted in her HPI. All questions were answered.  All lab and sonogram results have been reviewed. Comments:    Assessment:  1.  Pregnancy at 5763w2d,  Estimated Date of Delivery: 10/14/16 :                          2.  CHTN                        3.  +HIV undetectable  Medication(s) Plans:  Continue labetalol   Treatment Plan:  Induction set up for next week, AZT infusion, NST Tuesday afternoon  Return in about 4 days (around 10/06/2016) for afternoon, , NST, HROB, with Dr Despina HiddenEure. for appointment for high risk OB care  No orders of the defined types were placed in this encounter.  Orders Placed This Encounter  Procedures  . POCT urinalysis dipstick

## 2016-10-06 ENCOUNTER — Telehealth: Payer: Self-pay | Admitting: *Deleted

## 2016-10-06 ENCOUNTER — Other Ambulatory Visit: Payer: Medicaid Other | Admitting: Obstetrics & Gynecology

## 2016-10-06 NOTE — Telephone Encounter (Signed)
Pt requesting a note for [facility] for transportation for her appt tomorrow. Noted completed and faxed per pt request.

## 2016-10-07 ENCOUNTER — Encounter: Payer: Self-pay | Admitting: Obstetrics & Gynecology

## 2016-10-07 ENCOUNTER — Ambulatory Visit (INDEPENDENT_AMBULATORY_CARE_PROVIDER_SITE_OTHER): Payer: Medicaid Other | Admitting: Obstetrics & Gynecology

## 2016-10-07 VITALS — BP 130/100 | HR 78 | Wt 302.0 lb

## 2016-10-07 DIAGNOSIS — Z1389 Encounter for screening for other disorder: Secondary | ICD-10-CM

## 2016-10-07 DIAGNOSIS — Z3A38 38 weeks gestation of pregnancy: Secondary | ICD-10-CM | POA: Diagnosis not present

## 2016-10-07 DIAGNOSIS — Z21 Asymptomatic human immunodeficiency virus [HIV] infection status: Secondary | ICD-10-CM

## 2016-10-07 DIAGNOSIS — O10912 Unspecified pre-existing hypertension complicating pregnancy, second trimester: Secondary | ICD-10-CM

## 2016-10-07 DIAGNOSIS — Z331 Pregnant state, incidental: Secondary | ICD-10-CM

## 2016-10-07 DIAGNOSIS — O0993 Supervision of high risk pregnancy, unspecified, third trimester: Secondary | ICD-10-CM

## 2016-10-07 DIAGNOSIS — O98713 Human immunodeficiency virus [HIV] disease complicating pregnancy, third trimester: Secondary | ICD-10-CM

## 2016-10-07 DIAGNOSIS — B20 Human immunodeficiency virus [HIV] disease: Secondary | ICD-10-CM

## 2016-10-07 LAB — POCT URINALYSIS DIPSTICK
Blood, UA: NEGATIVE
GLUCOSE UA: NEGATIVE
Ketones, UA: NEGATIVE
LEUKOCYTES UA: NEGATIVE
NITRITE UA: NEGATIVE
Protein, UA: NEGATIVE

## 2016-10-07 NOTE — Progress Notes (Signed)
Fetal Surveillance Testing today:  Reactive NST   High Risk Pregnancy Diagnosis(es):   CHTN, +HIV undetectable load  G2P1001 7130w2d Estimated Date of Delivery: 10/14/16  Blood pressure (!) 130/100, pulse 78, weight (!) 302 lb (137 kg), last menstrual period 01/08/2016.  Urinalysis: Negative   HPI: The patient is being seen today for ongoing management of as above. Today she reports no problems   BP weight and urine results all reviewed and noted. Patient reports good fetal movement, denies any bleeding and no rupture of membranes symptoms or regular contractions.  Fundal Height:  42 Fetal Heart rate:  143 Edema:  none  Patient is without complaints other than noted in her HPI. All questions were answered.  All lab and sonogram results have been reviewed. Comments:    Assessment:  1.  Pregnancy at 7030w2d,  Estimated Date of Delivery: 10/14/16 :                          2.  CHTN                        3.  +HIV undetectable  Medication(s) Plans:  Continue labetalol   Treatment Plan: Induction in 2 days  Return in about 12 days (around 10/19/2016) for BP check, with Dr Despina HiddenEure. for appointment for high risk OB care  No orders of the defined types were placed in this encounter.  Orders Placed This Encounter  Procedures  . POCT urinalysis dipstick

## 2016-10-09 ENCOUNTER — Inpatient Hospital Stay (HOSPITAL_COMMUNITY)
Admission: RE | Admit: 2016-10-09 | Discharge: 2016-10-13 | DRG: 774 | Disposition: A | Discharge status: Home / Self Care | Payer: Medicaid Other | Source: Ambulatory Visit | Attending: Obstetrics and Gynecology | Admitting: Obstetrics and Gynecology

## 2016-10-09 ENCOUNTER — Encounter (HOSPITAL_COMMUNITY): Payer: Self-pay

## 2016-10-09 VITALS — BP 106/88 | HR 94 | Temp 98.2°F | Resp 18 | Ht 62.0 in | Wt 310.0 lb

## 2016-10-09 DIAGNOSIS — O99824 Streptococcus B carrier state complicating childbirth: Secondary | ICD-10-CM | POA: Diagnosis present

## 2016-10-09 DIAGNOSIS — O9902 Anemia complicating childbirth: Secondary | ICD-10-CM | POA: Diagnosis present

## 2016-10-09 DIAGNOSIS — Z87891 Personal history of nicotine dependence: Secondary | ICD-10-CM

## 2016-10-09 DIAGNOSIS — D649 Anemia, unspecified: Secondary | ICD-10-CM | POA: Diagnosis present

## 2016-10-09 DIAGNOSIS — Z21 Asymptomatic human immunodeficiency virus [HIV] infection status: Secondary | ICD-10-CM | POA: Diagnosis present

## 2016-10-09 DIAGNOSIS — Z3A39 39 weeks gestation of pregnancy: Secondary | ICD-10-CM | POA: Diagnosis not present

## 2016-10-09 DIAGNOSIS — O10919 Unspecified pre-existing hypertension complicating pregnancy, unspecified trimester: Secondary | ICD-10-CM | POA: Diagnosis present

## 2016-10-09 DIAGNOSIS — O1002 Pre-existing essential hypertension complicating childbirth: Principal | ICD-10-CM | POA: Diagnosis present

## 2016-10-09 DIAGNOSIS — O9872 Human immunodeficiency virus [HIV] disease complicating childbirth: Secondary | ICD-10-CM | POA: Diagnosis present

## 2016-10-09 HISTORY — DX: Anemia, unspecified: D64.9

## 2016-10-09 LAB — CBC
HCT: 32.7 % — ABNORMAL LOW (ref 36.0–46.0)
HEMOGLOBIN: 10.9 g/dL — AB (ref 12.0–15.0)
MCH: 27.1 pg (ref 26.0–34.0)
MCHC: 33.3 g/dL (ref 30.0–36.0)
MCV: 81.3 fL (ref 78.0–100.0)
PLATELETS: 193 10*3/uL (ref 150–400)
RBC: 4.02 MIL/uL (ref 3.87–5.11)
RDW: 14.4 % (ref 11.5–15.5)
WBC: 7.4 10*3/uL (ref 4.0–10.5)

## 2016-10-09 LAB — TYPE AND SCREEN
ABO/RH(D): B POS
ANTIBODY SCREEN: NEGATIVE

## 2016-10-09 LAB — RPR: RPR Ser Ql: NONREACTIVE

## 2016-10-09 LAB — ABO/RH: ABO/RH(D): B POS

## 2016-10-09 MED ORDER — EMTRICITABINE-TENOFOVIR AF 200-25 MG PO TABS
1.0000 | ORAL_TABLET | Freq: Every day | ORAL | Status: DC
  Administered 2016-10-09 – 2016-10-11 (×3): 1 via ORAL
  Filled 2016-10-09 (×4): qty 1

## 2016-10-09 MED ORDER — LABETALOL HCL 200 MG PO TABS
200.0000 mg | ORAL_TABLET | Freq: Two times a day (BID) | ORAL | Status: DC
  Administered 2016-10-10 – 2016-10-11 (×3): 200 mg via ORAL
  Filled 2016-10-09 (×7): qty 1

## 2016-10-09 MED ORDER — ZIDOVUDINE 10 MG/ML IV SOLN
2.0000 mg/kg | Freq: Once | INTRAVENOUS | Status: AC
  Administered 2016-10-09: 281 mg via INTRAVENOUS
  Filled 2016-10-09: qty 28.1

## 2016-10-09 MED ORDER — LACTATED RINGERS IV SOLN: 500.0000 mL | INTRAVENOUS | Status: DC | PRN

## 2016-10-09 MED ORDER — OXYCODONE-ACETAMINOPHEN 5-325 MG PO TABS: 1.0000 | ORAL_TABLET | ORAL | Status: DC | PRN

## 2016-10-09 MED ORDER — OXYTOCIN BOLUS FROM INFUSION
500.0000 mL | Freq: Once | INTRAVENOUS | Status: AC
Start: 2016-10-09 — End: 2016-10-11
  Administered 2016-10-11: 500 mL via INTRAVENOUS

## 2016-10-09 MED ORDER — LACTATED RINGERS IV SOLN
INTRAVENOUS | Status: DC
  Administered 2016-10-09 – 2016-10-10 (×4): via INTRAVENOUS
  Administered 2016-10-10: 125 mL/h via INTRAVENOUS
  Administered 2016-10-11: 01:00:00 via INTRAVENOUS

## 2016-10-09 MED ORDER — LIDOCAINE HCL (PF) 1 % IJ SOLN
30.0000 mL | INTRAMUSCULAR | Status: DC | PRN
  Filled 2016-10-09: qty 30

## 2016-10-09 MED ORDER — FENTANYL CITRATE (PF) 100 MCG/2ML IJ SOLN
100.0000 ug | INTRAMUSCULAR | Status: DC | PRN
  Administered 2016-10-10: 100 ug via INTRAVENOUS
  Filled 2016-10-09: qty 2

## 2016-10-09 MED ORDER — ALBUTEROL SULFATE (2.5 MG/3ML) 0.083% IN NEBU: 2.5000 mg | INHALATION_SOLUTION | Freq: Four times a day (QID) | RESPIRATORY_TRACT | Status: DC | PRN

## 2016-10-09 MED ORDER — ONDANSETRON HCL 4 MG/2ML IJ SOLN: 4.0000 mg | Freq: Four times a day (QID) | INTRAMUSCULAR | Status: DC | PRN

## 2016-10-09 MED ORDER — ETRAVIRINE 100 MG PO TABS
200.0000 mg | ORAL_TABLET | Freq: Every day | ORAL | Status: DC
  Administered 2016-10-09 – 2016-10-10 (×2): 200 mg via ORAL
  Filled 2016-10-09 (×4): qty 2

## 2016-10-09 MED ORDER — VANCOMYCIN HCL IN DEXTROSE 1-5 GM/200ML-% IV SOLN
1000.0000 mg | Freq: Two times a day (BID) | INTRAVENOUS | Status: DC
Start: 2016-10-09 — End: 2016-10-11
  Administered 2016-10-09 – 2016-10-11 (×5): 1000 mg via INTRAVENOUS
  Filled 2016-10-09 (×6): qty 200

## 2016-10-09 MED ORDER — FLEET ENEMA 7-19 GM/118ML RE ENEM
1.0000 | ENEMA | RECTAL | Status: DC | PRN
Start: 2016-10-09 — End: 2016-10-11

## 2016-10-09 MED ORDER — SOD CITRATE-CITRIC ACID 500-334 MG/5ML PO SOLN
30.0000 mL | ORAL | Status: DC | PRN
Start: 2016-10-09 — End: 2016-10-11

## 2016-10-09 MED ORDER — OXYTOCIN 40 UNITS IN LACTATED RINGERS INFUSION - SIMPLE MED
2.5000 [IU]/h | INTRAVENOUS | Status: DC
  Filled 2016-10-09: qty 1000

## 2016-10-09 MED ORDER — ZIDOVUDINE 10 MG/ML IV SOLN
1.0000 mg/kg/h | INTRAVENOUS | Status: DC
  Administered 2016-10-09 – 2016-10-10 (×6): 1 mg/kg/h via INTRAVENOUS
  Filled 2016-10-09 (×6): qty 40

## 2016-10-09 MED ORDER — ACETAMINOPHEN 325 MG PO TABS
650.0000 mg | ORAL_TABLET | ORAL | Status: DC | PRN
Start: 2016-10-09 — End: 2016-10-11

## 2016-10-09 MED ORDER — OXYCODONE-ACETAMINOPHEN 5-325 MG PO TABS: 2.0000 | ORAL_TABLET | ORAL | Status: DC | PRN

## 2016-10-09 MED ORDER — MISOPROSTOL 25 MCG QUARTER TABLET
25.0000 ug | ORAL_TABLET | ORAL | Status: DC
  Administered 2016-10-09 (×3): 25 ug via VAGINAL
  Filled 2016-10-09 (×3): qty 0.25

## 2016-10-09 NOTE — Anesthesia Pain Management Evaluation Note (Signed)
  [facility] Pain Management Visit Note  Patient: Theresa Becker, 30 y.o., female  "Hello [facility] am a [facility] the [facility]. We have an anesthesia team available at all times to provide care throughout the hospital, including epidural management and anesthesia for C-section. I don't know your plan for the delivery whether it a natural birth, water birth, IV sedation, nitrous supplementation, doula or epidural, but we want to meet your pain goals."   1.Was your pain managed to your expectations on prior hospitalizations?   Yes   2.What is your expectation for pain management during this hospitalization?     Epidural  3.How can we help you reach that goal? Epidural when ready.  Record the patient's initial score and the patient's pain goal.   Pain: 0  Pain Goal: 4 [facility] wants you to be able to say your pain was always managed very well.  Abdulmalik Darco L 10/09/2016

## 2016-10-09 NOTE — Progress Notes (Signed)
Pt seen. Doing well. No complaints. Not feeling contractions. Cervix still closed/T/H. Second cytotec placed.

## 2016-10-09 NOTE — Progress Notes (Signed)
Patient seen. Doing well. No contractions or cramping felt 3rd cytotec placed. Cervix c/t/h.

## 2016-10-09 NOTE — H&P (Signed)
LABOR AND DELIVERY ADMISSION HISTORY AND PHYSICAL NOTE  Theresa Becker is a 30 y.o. female G2P1001 with IUP at 9467w2d by LMPc/w13wk US presenting for IOL due to [place]. Patient has been on labetolol. She additionally has history of HIV positive. Viral load is undectable. She has been compliant with home regimen.   She reports positive fetal movement. She denies leakage of fluid or vaginal bleeding.  Prenatal History/Complications:  Past Medical History: Past Medical History:  Diagnosis Date  . Anemia 2017   taking Iron supplements during third trimester  . Bronchitis   . HIV disease (HCC)   . Hypertension    on PO medication  . Obesity     Past Surgical History: Past Surgical History:  Procedure Laterality Date  . NO PAST SURGERIES      Obstetrical History: OB History    Gravida Para Term Preterm AB Living   2 1 1  0 0 1   SAB TAB Ectopic Multiple Live Births   0 0 0 0 1      Social History: Social History   Social History  . Marital status: Single    Spouse name: N/A  . Number of children: N/A  . Years of education: N/A   Social History Main Topics  . Smoking status: Former Games developermoker  . Smokeless tobacco: Never Used  . Alcohol use No  . Drug use: No  . Sexual activity: Not Currently    Birth control/ protection: None   Other Topics Concern  . None   Social History Narrative  . None    Family History: History reviewed. No pertinent family history.  Allergies: Allergies  Allergen Reactions  . Penicillins Swelling and Other (See Comments)    Reaction:  Lip swelling Has patient had a PCN reaction causing immediate rash, facial/tongue/throat swelling, SOB or lightheadedness with hypotension: Yes Has patient had a PCN reaction causing severe rash involving mucus membranes or skin necrosis: No Has patient had a PCN reaction that required hospitalization No Has patient had a PCN reaction occurring within the last 10 years: No If all of the above answers are  "NO", then may proceed with Cephalosporin use.    Prescriptions Prior to Admission  Medication Sig Dispense Refill Last Dose  . albuterol (PROVENTIL HFA;VENTOLIN HFA) 108 (90 Base) MCG/ACT inhaler Inhale 1-2 puffs into the lungs every 6 (six) hours as needed for wheezing or shortness of breath. 1 Inhaler 11 Past Week at Unknown time  . emtricitabine-tenofovir (TRUVADA) 200-300 MG per tablet Take 1 tablet by mouth at bedtime.    10/08/2016 at Unknown time  . Etravirine (INTELENCE) 200 MG TABS Take 200 mg by mouth at bedtime.    10/08/2016 at Unknown time  . labetalol (NORMODYNE) 200 MG tablet Take 1 tablet (200 mg total) by mouth 2 (two) times daily. 60 tablet 3 10/09/2016 at 0500  . Prenatal MV-Min-FA-Omega-3 (PRENATAL GUMMIES/DHA & FA) 0.4-32.5 MG CHEW Chew 2 each by mouth daily.   10/08/2016 at Unknown time     Review of Systems   All systems reviewed and negative except as stated in HPI  Blood pressure 114/66, pulse 99, temperature 98.4 F (36.9 C), temperature source Oral, resp. rate 16, height 5\' 2"  (1.575 m), weight (!) 310 lb (140.6 kg), last menstrual period 01/08/2016. General appearance: alert, cooperative and appears stated age Lungs: clear to auscultation bilaterally Heart: regular rate and rhythm Abdomen: soft, non-tender; bowel sounds normal Extremities: No calf swelling or tenderness Presentation: cephalic Fetal monitoring: Cat  I tracing Uterine activity: irregular contractions Dilation: Fingertip Effacement (%): Thick Station: -3 Exam by:: Enis SlipperJane Bailey, RN   Prenatal labs: ABO, Rh: --/--/B POS (11/24 [id number]) Antibody: NEG (11/24 0718) Rubella: !Error! RPR: Non Reactive (11/07 1043)  HBsAg: Negative (05/31 1207)  HIV: Comment (11/07 1043)  GBS: Positive (11/12 0000)  1 hr glucola: 150 done at 36 wk EFW nl so no f/o 3hr done,diet discussed Genetic screening:  Indeterminate NI/IT: 2nd IT never drawn  Anatomy US: wnl  Prenatal Transfer Tool  Maternal Diabetes:  No Genetic Screening: Normal Maternal Ultrasounds/Referrals: Normal Fetal Ultrasounds or other Referrals:  None Maternal Substance Abuse:  No Significant Maternal Medications:  Meds include: Other: On tri-therapy for +HIV Significant Maternal Lab Results: None  Results for orders placed or performed during the hospital encounter of 10/09/16 (from the past 24 hour(s))  [facility]   Collection Time: 10/09/16  7:18 AM  Result Value Ref Range   ABO/RH(D) B POS    Antibody Screen NEG    Sample Expiration 10/12/2016   CBC   Collection Time: 10/09/16  7:26 AM  Result Value Ref Range   WBC 7.4 4.0 - 10.5 K/uL   RBC 4.02 3.87 - 5.11 MIL/uL   Hemoglobin 10.9 (L) 12.0 - 15.0 g/dL   HCT 19.132.7 (L) 47.836.0 - 29.546.0 %   MCV 81.3 78.0 - 100.0 fL   MCH 27.1 26.0 - 34.0 pg   MCHC 33.3 30.0 - 36.0 g/dL   RDW 62.114.4 30.811.5 - 65.715.5 %   Platelets 193 150 - 400 K/uL    Patient Active Problem List   Diagnosis Date Noted  . Chronic hypertension affecting pregnancy 10/09/2016  . Chronic hypertension with exacerbation during pregnancy in second trimester 07/03/2016  . Supervision of high risk pregnancy, antepartum 04/15/2016  . HIV (human immunodeficiency virus infection) (HCC) 04/15/2016  . Obesity 04/15/2016    Assessment: Theresa ShaggySheena C Becker is a 30 y.o. G2P1001 at 6475w2d [facility]  #Labor:Anticipate SVD. Cytotec vaginally. Consider foley if possible. Plan for pitocin 4 hrs after cytotec complete #Pain: IV pain meds prn/Epidrual on request  #FWB: Cat I tracing #ID:  GBS (+)-Vancomycin, HIV+ undectable viral load AZT drip #MOF: Bottle #MOC:OCP #Circ:  Outpt #cHTN: On Labetalol 200mg  BID PO.  WALLACE, NOAH I, DO PGY-3 10/09/2016, 10:29 AM  OB FELLOW HISTORY AND PHYSICAL ATTESTATION  I have seen and examined this patient; I agree with above documentation in the resident's note.    Ernestina Pennaicholas Jace Dowe 10/09/2016, 10:45 AM

## 2016-10-10 ENCOUNTER — Inpatient Hospital Stay (HOSPITAL_COMMUNITY): Payer: Medicaid Other | Admitting: Anesthesiology

## 2016-10-10 ENCOUNTER — Encounter (HOSPITAL_COMMUNITY): Payer: Self-pay

## 2016-10-10 LAB — CBC
HCT: 32.5 % — ABNORMAL LOW (ref 36.0–46.0)
Hemoglobin: 10.8 g/dL — ABNORMAL LOW (ref 12.0–15.0)
MCH: 27.3 pg (ref 26.0–34.0)
MCHC: 33.2 g/dL (ref 30.0–36.0)
MCV: 82.1 fL (ref 78.0–100.0)
PLATELETS: 175 10*3/uL (ref 150–400)
RBC: 3.96 MIL/uL (ref 3.87–5.11)
RDW: 14.7 % (ref 11.5–15.5)
WBC: 8.4 10*3/uL (ref 4.0–10.5)

## 2016-10-10 MED ORDER — EPHEDRINE 5 MG/ML INJ
10.0000 mg | INTRAVENOUS | Status: DC | PRN
Start: 2016-10-10 — End: 2016-10-11
  Filled 2016-10-10: qty 4

## 2016-10-10 MED ORDER — FENTANYL 2.5 MCG/ML BUPIVACAINE 1/10 % EPIDURAL INFUSION (WH - ANES)
INTRAMUSCULAR | Status: AC
  Filled 2016-10-10: qty 100

## 2016-10-10 MED ORDER — MISOPROSTOL 200 MCG PO TABS
50.0000 ug | ORAL_TABLET | ORAL | Status: DC
  Administered 2016-10-10: 50 ug via ORAL
  Filled 2016-10-10: qty 0.5

## 2016-10-10 MED ORDER — FENTANYL 2.5 MCG/ML BUPIVACAINE 1/10 % EPIDURAL INFUSION (WH - ANES)
14.0000 mL/h | INTRAMUSCULAR | Status: DC | PRN
  Administered 2016-10-10 – 2016-10-11 (×2): 14 mL/h via EPIDURAL
  Filled 2016-10-10 (×2): qty 100

## 2016-10-10 MED ORDER — PHENYLEPHRINE 40 MCG/ML (10ML) SYRINGE FOR IV PUSH (FOR BLOOD PRESSURE SUPPORT)
80.0000 ug | PREFILLED_SYRINGE | INTRAVENOUS | Status: DC | PRN
  Filled 2016-10-10: qty 5

## 2016-10-10 MED ORDER — LIDOCAINE HCL (PF) 1 % IJ SOLN
INTRAMUSCULAR | Status: DC | PRN
  Administered 2016-10-10 (×2): 8 mL via EPIDURAL

## 2016-10-10 MED ORDER — OXYTOCIN 40 UNITS IN LACTATED RINGERS INFUSION - SIMPLE MED
1.0000 m[IU]/min | INTRAVENOUS | Status: DC
  Administered 2016-10-10: 4 m[IU]/min via INTRAVENOUS
  Administered 2016-10-10: 2 m[IU]/min via INTRAVENOUS
  Administered 2016-10-11: 12 m[IU]/min via INTRAVENOUS

## 2016-10-10 MED ORDER — LACTATED RINGERS IV SOLN
500.0000 mL | Freq: Once | INTRAVENOUS | Status: AC
  Administered 2016-10-10: 500 mL via INTRAVENOUS

## 2016-10-10 MED ORDER — DIPHENHYDRAMINE HCL 50 MG/ML IJ SOLN: 12.5000 mg | INTRAMUSCULAR | Status: DC | PRN

## 2016-10-10 MED ORDER — PHENYLEPHRINE 40 MCG/ML (10ML) SYRINGE FOR IV PUSH (FOR BLOOD PRESSURE SUPPORT)
PREFILLED_SYRINGE | INTRAVENOUS | Status: AC
  Filled 2016-10-10: qty 10

## 2016-10-10 NOTE — Progress Notes (Signed)
Foley bulb placed. Pt resting comfortably with epidural. Continue to titrate Pitocin as ordered. FHTs 140s, + accels, no decels, moderate variablility. VSS: BP 132/73, Pulse 90bpm, SpO2 99% on room air.  I have seen and examined this patient and I agree with the above. Cam HaiSHAW, Norah Devin CNM 6:33 AM 10/11/2016

## 2016-10-10 NOTE — Anesthesia Preprocedure Evaluation (Signed)
Anesthesia Evaluation  Patient identified by MRN, date of birth, ID band Patient awake    Reviewed: Allergy & Precautions, H&P , NPO status , Patient's Chart, lab work & pertinent test results  Airway Mallampati: III  TM Distance: >3 FB Neck ROM: full    Dental no notable dental hx.    Pulmonary neg pulmonary ROS, former smoker,    Pulmonary exam normal        Cardiovascular hypertension, Pt. on home beta blockers Normal cardiovascular exam     Neuro/Psych negative neurological ROS  negative psych ROS   GI/Hepatic negative GI ROS, Neg liver ROS,   Endo/Other  Morbid obesity  Renal/GU negative Renal ROS     Musculoskeletal   Abdominal (+) + obese,   Peds  Hematology  (+) HIV,   Anesthesia Other Findings   Reproductive/Obstetrics (+) Pregnancy                             Anesthesia Physical Anesthesia Plan  ASA: III  Anesthesia Plan: Epidural   Post-op Pain Management:    Induction:   Airway Management Planned:   Additional Equipment:   Intra-op Plan:   Post-operative Plan:   Informed Consent: I have reviewed the patients History and Physical, chart, labs and discussed the procedure including the risks, benefits and alternatives for the proposed anesthesia with the patient or authorized representative who has indicated his/her understanding and acceptance.     Plan Discussed with:   Anesthesia Plan Comments:         Anesthesia Quick Evaluation

## 2016-10-10 NOTE — Progress Notes (Signed)
Patient ID: [name], female   DOB: [birth year], 30 y.o.   MRN: [id number] Doing well, some pain with contractions  Vitals:   10/10/16 1201 10/10/16 1251 10/10/16 1356 10/10/16 1707  BP: (!) 144/81 (!) 132/92 124/79 135/80  Pulse: 91 92 90 94  Resp: 17 20  17   Temp: 98 F (36.7 C)   98.1 F (36.7 C)  TempSrc: Oral   Oral  Weight:      Height:       FHR reactive but difficult to trace UCs not tracing, but pt states every 4 hrs  Dilation: Fingertip Effacement (%): 40 Cervical Position: Posterior Station: Ballotable Presentation: Vertex Exam by:: Wynelle BourgeoisMarie Adea Geisel CNM  Will change to Pitocin since effaced more.

## 2016-10-10 NOTE — Progress Notes (Signed)
Baseline FHR difficult to determine.  Pulse ox applied to mother

## 2016-10-10 NOTE — Progress Notes (Signed)
Patient ID: Theresa Becker, female   DOB: [birth year], 30 y.o.   MRN: [id number] Doing well, comfortable  Vitals:   10/09/16 2201 10/10/16 0155 10/10/16 0753 10/10/16 0917  BP: (!) 139/59 123/67 114/64 117/72  Pulse: 92 93 94 84  Resp:  18 16 20   Temp:  98 F (36.7 C) 98.4 F (36.9 C)   TempSrc:  Axillary Oral   Weight:      Height:       FHR reactive Category I UCs irregular and infrequent  Cervix not examined   Will change Cytotec to oral version 50mcg Discussed with Dr Emelda FearFerguson

## 2016-10-10 NOTE — Anesthesia Procedure Notes (Signed)
Epidural Patient location during procedure: OB Start time: 10/10/2016 9:40 PM End time: 10/10/2016 9:46 PM  Staffing Anesthesiologist: Leilani AbleHATCHETT, Lynsey Ange Performed: anesthesiologist   Preanesthetic Checklist Completed: patient identified, surgical consent, pre-op evaluation, timeout performed, IV checked, risks and benefits discussed and monitors and equipment checked  Epidural Patient position: sitting Prep: site prepped and draped and DuraPrep Patient monitoring: continuous pulse ox and blood pressure Approach: midline Location: L3-L4 Injection technique: LOR air  Needle:  Needle type: Tuohy  Needle gauge: 17 G Needle length: 9 cm and 9 Needle insertion depth: 9 cm Catheter type: closed end flexible Catheter size: 19 Gauge Catheter at skin depth: 15 cm Test dose: negative and Other  Assessment Sensory level: T9 Events: blood not aspirated, injection not painful, no injection resistance, negative IV test and no paresthesia  Additional Notes Reason for block:procedure for pain

## 2016-10-11 ENCOUNTER — Encounter (HOSPITAL_COMMUNITY): Payer: Self-pay

## 2016-10-11 DIAGNOSIS — O99824 Streptococcus B carrier state complicating childbirth: Secondary | ICD-10-CM

## 2016-10-11 DIAGNOSIS — O9872 Human immunodeficiency virus [HIV] disease complicating childbirth: Secondary | ICD-10-CM

## 2016-10-11 DIAGNOSIS — Z87891 Personal history of nicotine dependence: Secondary | ICD-10-CM

## 2016-10-11 DIAGNOSIS — O1002 Pre-existing essential hypertension complicating childbirth: Secondary | ICD-10-CM

## 2016-10-11 DIAGNOSIS — Z3A39 39 weeks gestation of pregnancy: Secondary | ICD-10-CM

## 2016-10-11 DIAGNOSIS — Z21 Asymptomatic human immunodeficiency virus [HIV] infection status: Secondary | ICD-10-CM

## 2016-10-11 DIAGNOSIS — O9902 Anemia complicating childbirth: Secondary | ICD-10-CM

## 2016-10-11 LAB — COMPREHENSIVE METABOLIC PANEL
ALBUMIN: 2.7 g/dL — AB (ref 3.5–5.0)
ALT: 41 U/L (ref 14–54)
AST: 84 U/L — ABNORMAL HIGH (ref 15–41)
Alkaline Phosphatase: 109 U/L (ref 38–126)
Anion gap: 8 (ref 5–15)
BUN: 9 mg/dL (ref 6–20)
CHLORIDE: 103 mmol/L (ref 101–111)
CO2: 24 mmol/L (ref 22–32)
Calcium: 8.9 mg/dL (ref 8.9–10.3)
Creatinine, Ser: 0.58 mg/dL (ref 0.44–1.00)
GFR calc Af Amer: >60 mL/min (ref >60)
GFR calc non Af Amer: >60 mL/min (ref >60)
GLUCOSE: 93 mg/dL (ref 65–99)
POTASSIUM: 3.6 mmol/L (ref 3.5–5.1)
SODIUM: 135 mmol/L (ref 135–145)
Total Bilirubin: 0.5 mg/dL (ref 0.3–1.2)
Total Protein: 6.5 g/dL (ref 6.5–8.1)

## 2016-10-11 LAB — CBC
HCT: 32.8 % — ABNORMAL LOW (ref 36.0–46.0)
Hemoglobin: 10.8 g/dL — ABNORMAL LOW (ref 12.0–15.0)
MCH: 26.7 pg (ref 26.0–34.0)
MCHC: 32.9 g/dL (ref 30.0–36.0)
MCV: 81 fL (ref 78.0–100.0)
Platelets: 172 10*3/uL (ref 150–400)
RBC: 4.05 MIL/uL (ref 3.87–5.11)
RDW: 14.4 % (ref 11.5–15.5)
WBC: 12.7 10*3/uL — AB (ref 4.0–10.5)

## 2016-10-11 MED ORDER — MEASLES, MUMPS & RUBELLA VAC ~~LOC~~ INJ: 0.5000 mL | INJECTION | Freq: Once | SUBCUTANEOUS | Status: DC

## 2016-10-11 MED ORDER — EMTRICITABINE-TENOFOVIR AF 200-25 MG PO TABS
1.0000 | ORAL_TABLET | Freq: Every day | ORAL | Status: DC
  Administered 2016-10-12 – 2016-10-13 (×2): 1 via ORAL
  Filled 2016-10-11 (×2): qty 1

## 2016-10-11 MED ORDER — ETRAVIRINE 100 MG PO TABS
200.0000 mg | ORAL_TABLET | Freq: Every day | ORAL | Status: DC
  Administered 2016-10-11 – 2016-10-12 (×2): 200 mg via ORAL
  Filled 2016-10-11 (×2): qty 2

## 2016-10-11 MED ORDER — ZIDOVUDINE 10 MG/ML IV SOLN
1.0000 mg/kg/h | INTRAVENOUS | Status: DC
  Administered 2016-10-11 (×3): 1 mg/kg/h via INTRAVENOUS
  Filled 2016-10-11: qty 40

## 2016-10-11 MED ORDER — IBUPROFEN 600 MG PO TABS
600.0000 mg | ORAL_TABLET | Freq: Four times a day (QID) | ORAL | Status: DC
  Administered 2016-10-11 – 2016-10-12 (×4): 600 mg via ORAL
  Filled 2016-10-11 (×4): qty 1

## 2016-10-11 MED ORDER — DIBUCAINE 1 % RE OINT: 1.0000 "application " | TOPICAL_OINTMENT | RECTAL | Status: DC | PRN

## 2016-10-11 MED ORDER — METHYLERGONOVINE MALEATE 0.2 MG/ML IJ SOLN: 0.2000 mg | INTRAMUSCULAR | Status: DC | PRN

## 2016-10-11 MED ORDER — FUROSEMIDE 10 MG/ML IJ SOLN
40.0000 mg | Freq: Once | INTRAMUSCULAR | Status: DC
  Filled 2016-10-11: qty 4

## 2016-10-11 MED ORDER — OXYCODONE HCL 5 MG PO TABS: 10.0000 mg | ORAL_TABLET | ORAL | Status: DC | PRN

## 2016-10-11 MED ORDER — SENNOSIDES-DOCUSATE SODIUM 8.6-50 MG PO TABS
2.0000 | ORAL_TABLET | ORAL | Status: DC
  Administered 2016-10-11: 2 via ORAL
  Filled 2016-10-11 (×2): qty 2

## 2016-10-11 MED ORDER — ONDANSETRON HCL 4 MG PO TABS: 4.0000 mg | ORAL_TABLET | ORAL | Status: DC | PRN

## 2016-10-11 MED ORDER — METHYLERGONOVINE MALEATE 0.2 MG PO TABS: 0.2000 mg | ORAL_TABLET | ORAL | Status: DC | PRN

## 2016-10-11 MED ORDER — DIPHENHYDRAMINE HCL 25 MG PO CAPS: 25.0000 mg | ORAL_CAPSULE | Freq: Four times a day (QID) | ORAL | Status: DC | PRN

## 2016-10-11 MED ORDER — ONDANSETRON HCL 4 MG/2ML IJ SOLN: 4.0000 mg | INTRAMUSCULAR | Status: DC | PRN

## 2016-10-11 MED ORDER — OXYTOCIN 40 UNITS IN LACTATED RINGERS INFUSION - SIMPLE MED: 2.5000 [IU]/h | INTRAVENOUS | Status: DC | PRN

## 2016-10-11 MED ORDER — BENZOCAINE-MENTHOL 20-0.5 % EX AERO
1.0000 "application " | INHALATION_SPRAY | CUTANEOUS | Status: DC | PRN
  Administered 2016-10-11: 1 via TOPICAL
  Filled 2016-10-11: qty 56

## 2016-10-11 MED ORDER — PRENATAL MULTIVITAMIN CH
1.0000 | ORAL_TABLET | Freq: Every day | ORAL | Status: DC
  Administered 2016-10-12: 1 via ORAL
  Filled 2016-10-11 (×2): qty 1

## 2016-10-11 MED ORDER — SIMETHICONE 80 MG PO CHEW: 80.0000 mg | CHEWABLE_TABLET | ORAL | Status: DC | PRN

## 2016-10-11 MED ORDER — OXYCODONE HCL 5 MG PO TABS: 5.0000 mg | ORAL_TABLET | ORAL | Status: DC | PRN

## 2016-10-11 MED ORDER — WITCH HAZEL-GLYCERIN EX PADS: 1.0000 "application " | MEDICATED_PAD | CUTANEOUS | Status: DC | PRN

## 2016-10-11 MED ORDER — ACETAMINOPHEN 325 MG PO TABS
650.0000 mg | ORAL_TABLET | ORAL | Status: DC | PRN
  Administered 2016-10-11 – 2016-10-13 (×6): 650 mg via ORAL
  Filled 2016-10-11 (×6): qty 2

## 2016-10-11 MED ORDER — FUROSEMIDE 10 MG/ML IJ SOLN
40.0000 mg | Freq: Once | INTRAMUSCULAR | Status: AC
  Administered 2016-10-11: 40 mg via INTRAVENOUS
  Filled 2016-10-11: qty 4

## 2016-10-11 MED ORDER — COCONUT OIL OIL: 1.0000 "application " | TOPICAL_OIL | Status: DC | PRN

## 2016-10-11 MED ORDER — TETANUS-DIPHTH-ACELL PERTUSSIS 5-2.5-18.5 LF-MCG/0.5 IM SUSP: 0.5000 mL | Freq: Once | INTRAMUSCULAR | Status: DC

## 2016-10-11 NOTE — Progress Notes (Signed)
Patient ID: Theresa ShaggySheena C Becker, female   DOB: 26-Jun-1986, 30 y.o.   MRN: [id number]  Pt resting; comfortable w/ epidural; FB recently out at 3am VSS, afeb, BP 115/42 FHR 130s, +accels, no decels, occ mi variables Ctx irreg w/ Pit @ 6712mu/min Cx 4+/50  IUP@term  HIV+ cHTN Latent phase  Will continue to increase Pit to get ctx reg Begin AZT w/ active labor  Cam HaiSHAW, Emiley Digiacomo CNM 10/11/2016 6:31 AM

## 2016-10-11 NOTE — Progress Notes (Signed)
Theresa Becker is a 30 y.o. G2P1001 at 3040w4d by ultrasound admitted for IOL for GHTN  Subjective:   Objective: BP 127/64   Pulse 79   Temp 98 F (36.7 C) (Oral)   Resp 20   Ht 5\' 2"  (1.575 m)   Wt (!) 310 lb (140.6 kg)   LMP 01/08/2016   SpO2 99%   BMI 56.70 kg/m  No intake/output data recorded. Total I/O In: -  Out: 950 [Urine:950]  FHT:  FHR: 130 bpm, variability: moderate,  accelerations:  Present,  decelerations:  Absent UC:   regular, every 5 minutes SVE:   Dilation: 5 Effacement (%): 60 Station: -3 Exam by:: Camelia EngK. Haynes, RN  Labs: Lab Results  Component Value Date   WBC 8.4 10/10/2016   HGB 10.8 (L) 10/10/2016   HCT 32.5 (L) 10/10/2016   MCV 82.1 10/10/2016   PLT 175 10/10/2016    Assessment / Plan: IOL at 39.4 for GHTN  Continue present POC  Labor: Progressing on Pitocin, will continue to increase then AROM Preeclampsia:  no signs or symptoms of toxicity Fetal Wellbeing:  Category I Pain Control:  Epidural I/D:  n/a Anticipated MOD:  NSVD  Theresa Becker 10/11/2016, 9:48 AM

## 2016-10-11 NOTE — Progress Notes (Signed)
Patient ID: [name], female   DOB: Apr 26, 1986, 30 y.o.   MRN: [id number]  Comfortable w/ epidural; AZT began BP 119/57 FHR 135-140, +accels, occ mi variables Ctx q 2-4 mins w/ Pit @ 2316mu/min Cx 5/60/-2/post  IUP@term  HIV+ cHTN- BP stable Latent labor  Anticipate being able to AROM later this morning  Cam HaiSHAW, KIMBERLY CNM 10/11/2016 6:33 AM

## 2016-10-12 MED ORDER — FUROSEMIDE 10 MG/ML PO SOLN
40.0000 mg | Freq: Once | ORAL | Status: AC
  Administered 2016-10-12: 40 mg via ORAL
  Filled 2016-10-12: qty 4

## 2016-10-12 MED ORDER — POLYSACCHARIDE IRON COMPLEX 150 MG PO CAPS
150.0000 mg | ORAL_CAPSULE | Freq: Two times a day (BID) | ORAL | Status: DC
  Administered 2016-10-12 – 2016-10-13 (×3): 150 mg via ORAL
  Filled 2016-10-12 (×3): qty 1

## 2016-10-12 NOTE — Progress Notes (Signed)
[facility] Tuesday, October 20, 2016, at 10AM.  CSW informed [facility] of scheduled appointment.   Blaine HamperAngel Boyd-Gilyard, MSW, LCSW Clinical Social Work [phone]

## 2016-10-12 NOTE — Progress Notes (Signed)
UR chart review completed.  

## 2016-10-12 NOTE — Progress Notes (Signed)
Post Partum Day #1 Subjective: no complaints, up ad lib, voiding and tolerating PO  Objective: Blood pressure 131/65, pulse 94, temperature 98 F (36.7 C), temperature source Oral, resp. rate 18, height 5\' 2"  (1.575 m), weight (!) 310 lb (140.6 kg), last menstrual period 01/08/2016, SpO2 99 %, unknown if currently breastfeeding.  Physical Exam:  General: alert, cooperative and no distress Lochia: appropriate Uterine Fundus: firm Incision: none DVT Evaluation: No evidence of DVT seen on physical exam. No cords or calf tenderness. Calf/Ankle edema is present.   Recent Labs  10/10/16 2051 10/11/16 1601  HGB 10.8* 10.8*  HCT 32.5* 32.8*    Assessment/Plan: Plan for discharge tomorrow and Contraception planning OCPs   LOS: 3 days   Roe CoombsRachelle A Caitriona Sundquist, CNM 10/12/2016, 7:25 AM

## 2016-10-12 NOTE — Anesthesia Postprocedure Evaluation (Signed)
Anesthesia Post Note  Patient: Theresa ShaggySheena C Becker  Procedure(s) Performed: * No procedures listed *  Patient location during evaluation: Mother Baby Anesthesia Type: Epidural Level of consciousness: awake and awake and alert Pain management: pain level controlled Vital Signs Assessment: post-procedure vital signs reviewed and stable Respiratory status: spontaneous breathing Cardiovascular status: stable Postop Assessment: no headache, no backache, epidural receding and patient able to bend at knees Anesthetic complications: no     Last Vitals:  Vitals:   10/11/16 2215 10/12/16 0615  BP: 136/71 131/65  Pulse: (!) 110 94  Resp: 18 18  Temp: 37.1 C 36.7 C    Last Pain:  Vitals:   10/12/16 0615  TempSrc: Oral  PainSc: 0-No pain   Pain Goal: Patients Stated Pain Goal: 2 (10/11/16 1815)               Edison PaceWILKERSON,Jahmani Staup

## 2016-10-13 LAB — COMPREHENSIVE METABOLIC PANEL
ALBUMIN: 2.6 g/dL — AB (ref 3.5–5.0)
ALT: 69 U/L — ABNORMAL HIGH (ref 14–54)
ANION GAP: 7 (ref 5–15)
AST: 107 U/L — ABNORMAL HIGH (ref 15–41)
Alkaline Phosphatase: 89 U/L (ref 38–126)
BUN: 13 mg/dL (ref 6–20)
CALCIUM: 8.7 mg/dL — AB (ref 8.9–10.3)
CO2: 25 mmol/L (ref 22–32)
Chloride: 106 mmol/L (ref 101–111)
Creatinine, Ser: 0.49 mg/dL (ref 0.44–1.00)
GFR calc non Af Amer: >60 mL/min (ref >60)
GLUCOSE: 95 mg/dL (ref 65–99)
POTASSIUM: 4 mmol/L (ref 3.5–5.1)
SODIUM: 138 mmol/L (ref 135–145)
Total Bilirubin: 0.2 mg/dL — ABNORMAL LOW (ref 0.3–1.2)
Total Protein: 5.9 g/dL — ABNORMAL LOW (ref 6.5–8.1)

## 2016-10-13 MED ORDER — AMLODIPINE BESYLATE 10 MG PO TABS: 10.0000 mg | ORAL_TABLET | Freq: Every day | ORAL | 2 refills | Status: DC

## 2016-10-13 MED ORDER — OXYCODONE HCL 5 MG PO TABS: 5.0000 mg | ORAL_TABLET | ORAL | 0 refills | Status: DC | PRN

## 2016-10-13 NOTE — Discharge Instructions (Signed)
Pregnancy and HIV HIV (human immunodeficiency virus) is a permanent (chronic) viral infection. HIV kills white blood cells that are called CD4 cells. These cells help to control your body's defense system (immune system) and fight off infection. If you do not have enough CD4 cells, you can develop infections, cancers, and other health problems. With treatment, HIV may not progress as quickly. If it is not treated, HIV progresses from early infection to a life-threatening condition that is called AIDS (acquired immunodeficiency syndrome). It is possible for people with HIV to live for many years in good health and to have families. Is it safe for me to have HIV and become pregnant? Now it is safer than ever for women with HIV to have children. Make sure to involve your HIV providers, case managers, social workers, and other HIV support systems when you are planning to get pregnant. Talk with your health care provider before you get pregnant. If you are already pregnant, talk with your health care provider as soon as possible. Discuss ways to make pregnancy safer for you and your baby. Your health care provider will recommend that you:  Have a complete review of your physical health and medical history, including labs, antiretroviral therapy (ART), and other medicines that you are taking.  Decrease the amount of HIV in your body (your viral load) to be as low as possible (undetectable). You can do this by taking your ART and visiting your health care provider regularly.  Use birth control (contraception) until you are ready to get pregnant.  Stop using alcohol, tobacco, and recreational drugs before you get pregnant, if you are currently taking any of those. You will also need to:  Find a pregnancy provider, such as a nurse-midwife or obstetrician. You need to:  Tell your pregnancy provider that you have HIV.  Take prenatal vitamins.  Go to regularly scheduled prenatal appointments.  Be sure  that your HIV providers and pregnancy providers work together to manage your care. What do I need to consider when trying to get pregnant? Talk to your health care providers to learn about your options and to get the help that you need. If you and your partner both have HIV:  Both of you should be taking ART and should have undetectable viral loads before you try to get pregnant.  Both of you should be tested and treated for any infections of the sex organs before you try to get pregnant.  Only have unprotected sex when you know that you can get pregnant (when you are fertile). Otherwise, use condoms. Ask your health care providers how to figure out when you are fertile during the month. If you have HIV but your female partner does not:  Protect your partner from getting HIV when you are trying to get pregnant.  Use artificial insemination with your partners semen or a donors semen. This is the safest way to get pregnant. Self-insemination is possible. Your health care provider can tell you how to do that.  Talk with your health care provider about getting HIV medicines for your partner to decrease his risk of getting HIV. These medicines are called pre-exposure prophylaxis (PrEP). Your partner should begin PrEP a month before you start trying to get pregnant, and he should continue it for a month after you become pregnant.  Only have unprotected sex when you are fertile. Otherwise, use condoms.  Your partner should get tested for HIV on a regular basis.  You should be taking ART and have  an undetectable viral load before you try to get pregnant.  Both of you should be tested and treated for any infections of the sex organs before you try to get pregnant. How will my baby be affected by HIV? There is no way to guarantee that your baby will be born without HIV. However, women who take ART and have an undetectable viral load before and during pregnancy rarely have babies with HIV. Some babies  have side effects from ART after they are born. These problems are minor, and they get better after birth. Talk with your health care provider about your concerns. Do I need to take ART medicine during pregnancy? Yes. The most important thing for you and your baby is for you to take ART and to keep an undetectable viral load. If you are not already on ART, your health care provider will recommend that you start ART as soon as possible. You need to reach an undetectable viral load for your own health and to prevent HIV in your baby. If you are already on ART, you will probably be able to stay on your current ART. However, your health care provider may make some changes to be sure you are getting the best possible treatment. Will HIV affect my labor and delivery? If you have a low viral load:  You will have the same type of labor and delivery as an uninfected woman. This means that you will have a vaginal delivery, or you may have a cesarean delivery if needed. Continue taking your ART on schedule as much as possible. If you have a higher viral load:  You will receive extra care during labor and delivery. Your health care provider will recommend that you have a cesarean delivery at 38 weeks of pregnancy. Starting a few hours before the cesarean delivery, you will receive an HIV medicine called ZDV through an IV tube. Continue taking your ART on schedule as much as possible. What do I need to do after my baby is born?  Keep taking your ART.  See your HIV providers regularly.  Get routine follow-up care from your pregnancy providers. Talk with them about your birth control options. Can I breastfeed my baby? Breastfeeding is not recommended. What do I need to do for my baby?  Start medical care for your baby. Your babys health care provider (pediatrician) should see your baby shortly after birth and regularly during the first year.  Give your baby all of his or her medicines as directed by the  health care provider. Your baby will be prescribed the medicine ZDV for 4-6 weeks after birth to decrease the risk of HIV. Other drugs may also be prescribed. It is important to start these medicines for your baby as soon after birth as possible. You need to give your baby these medicines every day as directed by the health care provider.  Bottle-feed your baby. Do not give your baby any food that you have chewed, because that could put your baby at increased risk for HIV.  Make sure that your baby gets tested for HIV at all of these ages:  52-69 weeks old.  1-2 months old.  4-6 months old. This information is not intended to replace advice given to you by your health care provider. Make sure you discuss any questions you have with your health care provider. Document Released: 02/08/2001 Document Revised: 04/06/2016 Document Reviewed: 07/17/2014 Elsevier Interactive Patient Education  2017 [employer] Postpartum Care After Vaginal Delivery The period of  time right after you deliver your newborn is called the postpartum period. What kind of medical care will I receive?  You may continue to receive fluids and medicines through an IV tube inserted into one of your veins.  If an incision was made near your vagina (episiotomy) or if you had some vaginal tearing during delivery, cold compresses may be placed on your episiotomy or your tear. This helps to reduce pain and swelling.  You may be given a squirt bottle to use when you go to the bathroom. You may use this until you are comfortable wiping as usual. To use the squirt bottle, follow these steps:  Before you urinate, fill the squirt bottle with warm water. Do not use hot water.  After you urinate, while you are sitting on the toilet, use the squirt bottle to rinse the area around your urethra and vaginal opening. This rinses away any urine and blood.  You may do this instead of wiping. As you start healing, you may use the squirt bottle  before wiping yourself. Make sure to wipe gently.  Fill the squirt bottle with clean water every time you use the bathroom.  You will be given sanitary pads to wear. How can I expect to feel?  You may not feel the need to urinate for several hours after delivery.  You will have some soreness and pain in your abdomen and vagina.  If you are breastfeeding, you may have uterine contractions every time you breastfeed for up to several weeks postpartum. Uterine contractions help your uterus return to its normal size.  It is normal to have vaginal bleeding (lochia) after delivery. The amount and appearance of lochia is often similar to a menstrual period in the first week after delivery. It will gradually decrease over the next few weeks to a dry, yellow-brown discharge. For most women, lochia stops completely by 6-8 weeks after delivery. Vaginal bleeding can vary from woman to woman.  Within the first few days after delivery, you may have breast engorgement. This is when your breasts feel heavy, full, and uncomfortable. Your breasts may also throb and feel hard, tightly stretched, warm, and tender. After this occurs, you may have milk leaking from your breasts.Your health care provider can help you relieve discomfort due to breast engorgement. Breast engorgement should go away within a few days.  You may feel more sad or worried than normal due to hormonal changes after delivery. These feelings should not last more than a few days. If these feelings do not go away after several days, speak with your health care provider. How should I care for myself?  Tell your health care provider if you have pain or discomfort.  Drink enough water to keep your urine clear or pale yellow.  Wash your hands thoroughly with soap and water for at least 20 seconds after changing your sanitary pads, after using the toilet, and before holding or feeding your baby.  If you are not breastfeeding, avoid touching your  breasts a lot. Doing this can make your breasts produce more milk.  If you become weak or lightheaded, or you feel like you might faint, ask for help before:  Getting out of bed.  Showering.  Change your sanitary pads frequently. Watch for any changes in your flow, such as a sudden increase in volume, a change in color, the passing of large blood clots. If you pass a blood clot from your vagina, save it to show to your health care provider.  Do not flush blood clots down the toilet without having your health care provider look at them.  Make sure that all your vaccinations are up to date. This can help protect you and your baby from getting certain diseases. You may need to have immunizations done before you leave the hospital.  If desired, talk with your health care provider about methods of family planning or birth control (contraception). How can I start bonding with my baby? Spending as much time as possible with your baby is very important. During this time, you and your baby can get to know each other and develop a bond. Having your baby stay with you in your room (rooming in) can give you time to get to know your baby. Rooming in can also help you become comfortable caring for your baby. Breastfeeding can also help you bond with your baby. How can I plan for returning home with my baby?  Make sure that you have a car seat installed in your vehicle.  Your car seat should be checked by a certified car seat installer to make sure that it is installed safely.  Make sure that your baby fits into the car seat safely.  Ask your health care provider any questions you have about caring for yourself or your baby. Make sure that you are able to contact your health care provider with any questions after leaving the hospital. This information is not intended to replace advice given to you by your health care provider. Make sure you discuss any questions you have with your health care  provider. Document Released: 08/30/2007 Document Revised: 04/06/2016 Document Reviewed: 10/07/2015 Elsevier Interactive Patient Education  2017 [employer]

## 2016-10-13 NOTE — Discharge Summary (Signed)
OB Discharge Summary     Patient Name: Theresa Becker DOB: 1986-07-05 MRN: [id number]  Date of admission: 10/09/2016 Delivering MD: Clayton BiblesWEINHOLD, SAMANTHA COCHRANE   Date of discharge: 10/13/2016  Admitting diagnosis: INDUCTION Intrauterine pregnancy: [id number]     Secondary diagnosis:  Active Problems:   Chronic hypertension affecting pregnancy  Additional problems: chronic HIV and chronic HTN, elevated LFTs     Discharge diagnosis: Term Pregnancy Delivered, CHTN and HIV(undetectable viral load)                                                                                                Post partum procedures:none  Augmentation: AROM, Pitocin, Cytotec and Foley Balloon  Complications: None  Hospital course:  Onset of Labor With Vaginal Delivery     30 y.o. yo [id number] at 6946w4d was admitted in Latent Labor on 10/09/2016. Patient had an uncomplicated labor course as follows:  Membrane Rupture Time/Date: 10:54 AM ,10/11/2016   Intrapartum Procedures: Episiotomy: None [1]                                         Lacerations:  None [1]  Patient had a delivery of a Viable infant. 10/11/2016  Information for the patient's newborn:  Dory HornColes, Boy Avaiah [[id number]][[id number]]  Delivery Method: Vag-Spont     Pateint had an uncomplicated postpartum course.  She is ambulating, tolerating a regular diet, passing flatus, and urinating well. Patient is discharged home in stable condition on 10/13/16. Blood pressure well controlled with labetalol per home dose during hospital course.Will discharge on norvasc 10mg . Pt had mild elevated LFTs in setting of possible drug-drug interaction during hospital course. Follow up evaluation with gastroenterology scheduled.    Physical exam Vitals:   10/11/16 2215 10/12/16 0615 10/12/16 1838 10/13/16 0556  BP: 136/71 131/65 134/77 106/88  Pulse: (!) 110 94 85 94  Resp: 18 18 18 18   Temp: 98.7 F (37.1 C) 98 F (36.7 C) 97.7 F (36.5 C) 98.2 F (36.8 C)  TempSrc:  Oral Oral Oral Oral  SpO2:      Weight:      Height:       General: alert, cooperative and no distress Lochia: appropriate Uterine Fundus: firm Incision: N/A DVT Evaluation: No evidence of DVT seen on physical exam. Negative Homan's sign. No cords or calf tenderness. Labs: Lab Results  Component Value Date   WBC 12.7 (H) 10/11/2016   HGB 10.8 (L) 10/11/2016   HCT 32.8 (L) 10/11/2016   MCV 81.0 10/11/2016   PLT 172 10/11/2016   CMP Latest Ref Rng & Units 10/13/2016  Glucose 65 - 99 mg/dL 95  BUN 6 - 20 mg/dL 13  Creatinine 1.300.44 - 8.651.00 mg/dL 7.840.49  Sodium 696135 - 295145 mmol/L 138  Potassium 3.5 - 5.1 mmol/L 4.0  Chloride 101 - 111 mmol/L 106  CO2 22 - 32 mmol/L 25  Calcium 8.9 - 10.3 mg/dL 2.8(U8.7(L)  Total Protein 6.5 - 8.1 g/dL 5.9(L)  Total Bilirubin 0.3 - 1.2  mg/dL 0.9(W0.2(L)  Alkaline Phos 38 - 126 U/L 89  AST 15 - 41 U/L 107(H)  ALT 14 - 54 U/L 69(H)    Discharge instruction: per After Visit Summary and "Baby and Me Booklet".  After visit meds:    Medication List    STOP taking these medications   labetalol 200 MG tablet Commonly known as:  NORMODYNE   PRENATAL GUMMIES/DHA & FA 0.4-32.5 MG Chew     TAKE these medications   albuterol 108 (90 Base) MCG/ACT inhaler Commonly known as:  PROVENTIL HFA;VENTOLIN HFA Inhale 1-2 puffs into the lungs every 6 (six) hours as needed for wheezing or shortness of breath.   amLODipine 10 MG tablet Commonly known as:  NORVASC Take 1 tablet (10 mg total) by mouth daily.   emtricitabine-tenofovir 200-300 MG tablet Commonly known as:  TRUVADA Take 1 tablet by mouth at bedtime.   INTELENCE 200 MG Tabs Generic drug:  Etravirine Take 200 mg by mouth at bedtime.       Diet: carb modified diet  Activity: Advance as tolerated. Pelvic rest for 6 weeks.   Outpatient follow up:6 weeks Follow up Appt:Future Appointments Date Time Provider Department Center  10/19/2016 10:15 AM Lazaro ArmsLuther H Eure, MD [facility]   Follow up  Visit:No Follow-up on file.  Postpartum contraception: Progesterone only pills  Newborn Data: Live born female  Birth Weight: 8 lb 5.9 oz (3795 g) APGAR: 9, 9  Baby Feeding: Bottle Disposition:home with mother   10/13/2016 Deforest HoylesWALLACE, NOAH I, DO  OB FELLOW DISCHARGE ATTESTATION  I have seen and examined this patient and agree with above documentation in the resident's note.   Ernestina PennaNicholas Sharvil Hoey, MD 1:50 PM

## 2016-10-13 NOTE — Progress Notes (Signed)
CSW received a telephone call from [place] requesting transportation assistance for MOB at d/c.  CSW met with MOB to discuss transportation barriers.  MOB denied transportation barriers and informed CSW that MOB's father will pick MOB and infant up tomorrow (10/13/16) for d/c.  MOB made CSW aware that MOB and infant were not being d/c today.  If d/c was going to be today, then MOB was going to need transportation assistance.    CSW updated nursing station that MOB is not in need of transportation assistance.   Laurey Arrow, MSW, LCSW Clinical Social Work [phone]

## 2016-10-19 ENCOUNTER — Encounter: Payer: Medicaid Other | Admitting: Obstetrics & Gynecology

## 2016-11-26 ENCOUNTER — Ambulatory Visit: Payer: Medicaid Other | Admitting: Obstetrics & Gynecology

## 2016-12-10 ENCOUNTER — Encounter: Payer: Self-pay | Admitting: Advanced Practice Midwife

## 2016-12-10 ENCOUNTER — Other Ambulatory Visit: Payer: Self-pay | Admitting: Advanced Practice Midwife

## 2016-12-10 ENCOUNTER — Ambulatory Visit (INDEPENDENT_AMBULATORY_CARE_PROVIDER_SITE_OTHER): Payer: Medicaid Other | Admitting: Advanced Practice Midwife

## 2016-12-10 DIAGNOSIS — N898 Other specified noninflammatory disorders of vagina: Secondary | ICD-10-CM | POA: Diagnosis not present

## 2016-12-10 MED ORDER — NORETHINDRONE 0.35 MG PO TABS
1.0000 | ORAL_TABLET | Freq: Every day | ORAL | 11 refills | Status: DC
Start: 2016-12-10 — End: 2018-01-04

## 2016-12-10 MED ORDER — CIPROFLOXACIN HCL 500 MG PO TABS: 500.0000 mg | ORAL_TABLET | Freq: Two times a day (BID) | ORAL | 0 refills | Status: DC

## 2016-12-10 MED ORDER — NORETHINDRONE 0.35 MG PO TABS: 1.0000 | ORAL_TABLET | Freq: Every day | ORAL | 11 refills | Status: DC

## 2016-12-10 NOTE — Progress Notes (Signed)
Theresa Becker is a 31 y.o. who presents for a postpartum visit. She is 7 weeks postpartum following a spontaneous vaginal delivery. I have fully reviewed the prenatal and intrapartum course. The delivery was at 39.2 gestational weeks.  IOL for CHTN/HIV +. Anesthesia: epidural. Postpartum course has been uneventful. She was DC'd home on Norvasc 10mg --did not f/u for BP check as scheduled. . Did not have HTN before pregnancy ([facility] [facility] [place], has never been high.).  No care from 1st PNV (120/90) until 25 weeks, where it was high, so labeled CHTN and started on meds.  Baby's course has been uneventful. Baby is feeding by bottle. Bleeding: tampon from 3 weeks ago fell out last night.  period started soon after. Pt notices the smell is stil there. . Bowel function is normal. Bladder function is normal. Patient is sexually active. Contraception method is none. Postpartum depression screening: negative.   Current Outpatient Prescriptions:  .  emtricitabine-tenofovir (TRUVADA) 200-300 MG per tablet, Take 1 tablet by mouth at bedtime. , Disp: , Rfl:  .  Etravirine (INTELENCE) 200 MG TABS, Take 200 mg by mouth at bedtime. , Disp: , Rfl:  .  ciprofloxacin (CIPRO) 500 MG tablet, Take 1 tablet (500 mg total) by mouth 2 (two) times daily., Disp: 14 tablet, Rfl: 0 .  norethindrone (MICRONOR,CAMILA,ERRIN) 0.35 MG tablet, Take 1 tablet (0.35 mg total) by mouth daily., Disp: 1 Package, Rfl: 11  Review of Systems   Constitutional: Negative for fever and chills Eyes: Negative for visual disturbances Respiratory: Negative for shortness of breath, dyspnea Cardiovascular: Negative for chest pain or palpitations  Gastrointestinal: Negative for vomiting, diarrhea and constipation Genitourinary: Negative for dysuria and urgency Musculoskeletal: Negative for back pain, joint pain, myalgias  Neurological: Negative for dizziness and headaches    Objective:     Vitals:   12/10/16 1417  BP: (!) 140/100   Pulse: 72   General:  alert, cooperative and no distress   Breasts:  negative  Lungs: clear to auscultation bilaterally  Heart:  regular rate and rhythm  Abdomen: Soft, nontender   Vulva:  normal  Vagina: Slight odor, blood from period.  Wet prep some WBC's only.   Cervix:  closed  Corpus: Well involuted     Rectal Exam: no hemorrhoids        Assessment:    normal postpartum exam. CHTN vs GHTN.  Discussed how time wil tell.  To start Norvasc 5 mg and stop it 4 days before next appt Plan:   1. Contraception: oral progesterone-only contraceptive Start today 2. Follow up 2/22 (baby will be 5414 weeks old, so can sort out CHTN vs GHTN. 3.  Rx cipro 500mg  BID X 7 d/t tampon in for 3 weeks/odor/WBC.  .Marland Kitchen

## 2016-12-10 NOTE — Patient Instructions (Addendum)
Take 1/2 tablet of Norvasc starting tomorrow  Stop taking your blood pressure medicine on the Thursday before your next appointment.

## 2016-12-14 ENCOUNTER — Ambulatory Visit: Payer: Medicaid Other | Admitting: Women's Health

## 2017-01-12 ENCOUNTER — Ambulatory Visit: Payer: Medicaid Other | Admitting: Advanced Practice Midwife

## 2017-01-19 ENCOUNTER — Ambulatory Visit: Payer: Medicaid Other | Admitting: Obstetrics & Gynecology

## 2017-07-13 ENCOUNTER — Encounter (HOSPITAL_COMMUNITY): Payer: Self-pay | Admitting: *Deleted

## 2017-07-13 ENCOUNTER — Emergency Department (HOSPITAL_COMMUNITY)
Admission: EM | Admit: 2017-07-13 | Discharge: 2017-07-14 | Disposition: A | Discharge status: Home / Self Care | Payer: Medicaid Other | Attending: Emergency Medicine | Admitting: Emergency Medicine

## 2017-07-13 DIAGNOSIS — B20 Human immunodeficiency virus [HIV] disease: Secondary | ICD-10-CM | POA: Insufficient documentation

## 2017-07-13 DIAGNOSIS — Z87891 Personal history of nicotine dependence: Secondary | ICD-10-CM | POA: Insufficient documentation

## 2017-07-13 DIAGNOSIS — I10 Essential (primary) hypertension: Secondary | ICD-10-CM | POA: Insufficient documentation

## 2017-07-13 DIAGNOSIS — M549 Dorsalgia, unspecified: Secondary | ICD-10-CM | POA: Insufficient documentation

## 2017-07-13 DIAGNOSIS — S8991XA Unspecified injury of right lower leg, initial encounter: Secondary | ICD-10-CM | POA: Diagnosis present

## 2017-07-13 DIAGNOSIS — W228XXA Striking against or struck by other objects, initial encounter: Secondary | ICD-10-CM | POA: Diagnosis not present

## 2017-07-13 DIAGNOSIS — Z79899 Other long term (current) drug therapy: Secondary | ICD-10-CM | POA: Diagnosis not present

## 2017-07-13 DIAGNOSIS — M791 Myalgia: Secondary | ICD-10-CM | POA: Insufficient documentation

## 2017-07-13 DIAGNOSIS — S8011XA Contusion of right lower leg, initial encounter: Secondary | ICD-10-CM | POA: Diagnosis not present

## 2017-07-13 DIAGNOSIS — Y9289 Other specified places as the place of occurrence of the external cause: Secondary | ICD-10-CM | POA: Insufficient documentation

## 2017-07-13 DIAGNOSIS — Y990 Civilian activity done for income or pay: Secondary | ICD-10-CM | POA: Diagnosis not present

## 2017-07-13 DIAGNOSIS — M25551 Pain in right hip: Secondary | ICD-10-CM | POA: Insufficient documentation

## 2017-07-13 DIAGNOSIS — Y9389 Activity, other specified: Secondary | ICD-10-CM | POA: Diagnosis not present

## 2017-07-13 MED ORDER — IBUPROFEN 800 MG PO TABS
800.0000 mg | ORAL_TABLET | Freq: Once | ORAL | Status: AC
  Administered 2017-07-14: 800 mg via ORAL
  Filled 2017-07-13: qty 1

## 2017-07-13 MED ORDER — METHOCARBAMOL 500 MG PO TABS
500.0000 mg | ORAL_TABLET | Freq: Once | ORAL | Status: AC
  Administered 2017-07-14: 500 mg via ORAL
  Filled 2017-07-13: qty 1
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
## 2017-07-13 NOTE — ED Provider Notes (Signed)
[facility]-EMERGENCY DEPT Provider Note   CSN: [id number] Arrival date & time: 07/13/17  2100     History   Chief Complaint Chief Complaint  Patient presents with  . Back Pain    HPI Theresa Becker is a 31 y.o. female.  Patient complains of right buttock, back and leg pain after being struck by a packing machine at work. States she was working her job when someone ran into the back of her leg with a packing machine that she describes as a large cart. She did not fall or hit her head. She had no preceding back or leg pain. No focal weakness, numbness or tingling. No bowel or bladder incontinence. No fever or vomiting. She was able to ambulate and bear weight. She is not taking any medication since.   The history is provided by the patient.  Back Pain   Pertinent negatives include no chest pain, no fever, no headaches, no abdominal pain, no dysuria and no weakness.    Past Medical History:  Diagnosis Date  . Anemia 2017   taking Iron supplements during third trimester  . Bronchitis   . HIV disease (HCC)   . Hypertension    on PO medication  . Obesity     Patient Active Problem List   Diagnosis Date Noted  . Chronic hypertension affecting pregnancy 10/09/2016  . Chronic hypertension with exacerbation during pregnancy in second trimester 07/03/2016  . Supervision of high risk pregnancy, antepartum 04/15/2016  . HIV (human immunodeficiency virus infection) (HCC) 04/15/2016  . Obesity 04/15/2016    Past Surgical History:  Procedure Laterality Date  . NO PAST SURGERIES      OB History    Gravida Para Term Preterm AB Living   2 2 2  0 0 2   SAB TAB Ectopic Multiple Live Births   0 0 0 0 2       Home Medications    Prior to Admission medications   Medication Sig Start Date End Date Taking? Authorizing Provider  ciprofloxacin (CIPRO) 500 MG tablet Take 1 tablet (500 mg total) by mouth 2 (two) times daily. 12/10/16   Cresenzo-Dishmon, Scarlette Calico, CNM  ciprofloxacin (CIPRO)  500 MG tablet TAKE 1 TABLET(500 MG) BY MOUTH TWICE DAILY 12/15/16   Cresenzo-Dishmon, Scarlette Calico, CNM  emtricitabine-tenofovir (TRUVADA) 200-300 MG per tablet Take 1 tablet by mouth at bedtime.     [provider]  Etravirine (INTELENCE) 200 MG TABS Take 200 mg by mouth at bedtime.     [provider]  norethindrone (MICRONOR,CAMILA,ERRIN) 0.35 MG tablet Take 1 tablet (0.35 mg total) by mouth daily. 12/10/16   Cresenzo-Dishmon, Scarlette Calico, CNM    Family History No family history on file.  Social History Social History  Substance Use Topics  . Smoking status: Former Games developer  . Smokeless tobacco: Never Used  . Alcohol use No     Allergies   Penicillins   Review of Systems Review of Systems  Constitutional: Negative for activity change, appetite change and fever.  HENT: Negative for congestion and postnasal drip.   Respiratory: Negative for cough, chest tightness and shortness of breath.   Cardiovascular: Negative for chest pain and leg swelling.  Gastrointestinal: Negative for abdominal pain, nausea and vomiting.  Genitourinary: Negative for dysuria, hematuria, vaginal bleeding and vaginal discharge.  Musculoskeletal: Positive for arthralgias, back pain and myalgias.  Skin: Negative for rash.  Neurological: Negative for dizziness, weakness, light-headedness and headaches.   all other systems are negative except as noted in the  HPI and PMH.     Physical Exam Updated Vital Signs BP (!) 134/101   Pulse 85   Temp 98.4 F (36.9 C) (Oral)   Resp 16   Ht 5\' 2"  (1.575 m)   Wt 132.5 kg (292 lb)   LMP 07/10/2017   SpO2 100%   BMI 53.41 kg/m   Physical Exam  Constitutional: She is oriented to person, place, and time. She appears well-developed and well-nourished. No distress.  HENT:  Head: Normocephalic and atraumatic.  Mouth/Throat: Oropharynx is clear and moist. No oropharyngeal exudate.  Eyes: Pupils are equal, round, and reactive to light. Conjunctivae and EOM  are normal.  Neck: Normal range of motion. Neck supple.  No meningismus.  Cardiovascular: Normal rate, regular rhythm, normal heart sounds and intact distal pulses.   No murmur heard. Pulmonary/Chest: Effort normal and breath sounds normal. No respiratory distress.  Abdominal: Soft. There is no tenderness. There is no rebound and no guarding.  Musculoskeletal: Normal range of motion. She exhibits tenderness. She exhibits no edema or deformity.  TTP R lateral hip. No deformity.  Pain with ROM of R hip/ TTP R paraspinal lumbar tenderness. No midline tenderness. 5/5 strength in bilateral lower extremities. Ankle plantar and dorsiflexion intact. Great toe extension intact bilaterally. +2 DP and PT pulses. +2 patellar reflexes bilaterally. Normal gait.   Neurological: She is alert and oriented to person, place, and time. No cranial nerve deficit. She exhibits normal muscle tone. Coordination normal.   5/5 strength throughout. CN 2-12 intact.Equal grip strength.   Skin: Skin is warm. Capillary refill takes less than 2 seconds.  Psychiatric: She has a normal mood and affect. Her behavior is normal.  Nursing note and vitals reviewed.    ED Treatments / Results  Labs (all labs ordered are listed, but only abnormal results are displayed) Labs Reviewed - No data to display  EKG  EKG Interpretation None       Radiology Dg Lumbar Spine Complete  Result Date: 07/14/2017 CLINICAL DATA:  Struck by a machine at work.  Back pain. EXAM: LUMBAR SPINE - COMPLETE 4+ VIEW COMPARISON:  Lumbar spine radiographs October 24, 2008 FINDINGS: Five non rib-bearing lumbar-type vertebral bodies are intact and aligned with maintenance of the lumbar lordosis. Intervertebral disc heights are normal. No destructive bony lesions. Symmetric iliac bone exostoses. Sacroiliac joints are symmetric. Included prevertebral and paraspinal soft tissue planes are non-suspicious. Phleboliths project in the pelvis. IMPRESSION:  Negative. Electronically Signed   By: Awilda Metro M.D.   On: 07/14/2017 02:07   Dg Hip Unilat W Or Wo Pelvis 2-3 Views Right  Result Date: 07/14/2017 CLINICAL DATA:  Struck by machine at work.  RIGHT hip pain. EXAM: DG HIP (WITH OR WITHOUT PELVIS) 2-3V RIGHT COMPARISON:  None. FINDINGS: There is no evidence of hip fracture or dislocation. Symmetric iliac exostoses. There is no evidence of arthropathy or other focal bone abnormality. Phleboliths project in the pelvis. IMPRESSION: Negative. Electronically Signed   By: Awilda Metro M.D.   On: 07/14/2017 02:06    Procedures Procedures (including critical care time)  Medications Ordered in ED Medications  ibuprofen (ADVIL,MOTRIN) tablet 800 mg (not administered)  methocarbamol (ROBAXIN) tablet 500 mg (not administered)     Initial Impression / Assessment and Plan / ED Course  I have reviewed the triage vital signs and the nursing notes.  Pertinent labs & imaging results that were available during my care of the patient were reviewed by me and considered in my medical  decision making (see chart for details).     Right hip and back pain after being struck by a machine at work. No loss of consciousness. No focal weakness, numbness or tingling.  Neurovascular intact. No open wounds.  X-rays are negative. Patient is able to ambulate and bear weight. We'll treat with anti-inflammatories. PCP follow-up. Return precautions discussed.  Final Clinical Impressions(s) / ED Diagnoses   Final diagnoses:  Contusion of leg, right, initial encounter    New Prescriptions New Prescriptions   No medications on file     Glynn Octave, MD 07/14/17 [phone]

## 2017-07-14 ENCOUNTER — Emergency Department (HOSPITAL_COMMUNITY): Payer: Medicaid Other

## 2017-07-14 LAB — POC URINE PREG, ED: PREG TEST UR: NEGATIVE

## 2017-07-14 MED ORDER — METHOCARBAMOL 500 MG PO TABS: 500.0000 mg | ORAL_TABLET | Freq: Two times a day (BID) | ORAL | 0 refills | Status: DC

## 2017-07-14 NOTE — Discharge Instructions (Signed)
Your xrays are negative. Use rest, ice, elevation, antiinflammatory medications and follow up with your doctor. Return to the ED if you develop new or worsening symptoms.

## 2017-11-09 ENCOUNTER — Other Ambulatory Visit: Payer: Self-pay

## 2017-11-09 ENCOUNTER — Encounter (HOSPITAL_COMMUNITY): Payer: Self-pay | Admitting: Emergency Medicine

## 2017-11-09 ENCOUNTER — Emergency Department (HOSPITAL_COMMUNITY)
Admission: EM | Admit: 2017-11-09 | Discharge: 2017-11-10 | Disposition: A | Discharge status: Home / Self Care | Payer: Medicaid Other | Attending: Emergency Medicine | Admitting: Emergency Medicine

## 2017-11-09 DIAGNOSIS — B20 Human immunodeficiency virus [HIV] disease: Secondary | ICD-10-CM | POA: Insufficient documentation

## 2017-11-09 DIAGNOSIS — R1084 Generalized abdominal pain: Secondary | ICD-10-CM | POA: Diagnosis not present

## 2017-11-09 DIAGNOSIS — I10 Essential (primary) hypertension: Secondary | ICD-10-CM | POA: Diagnosis not present

## 2017-11-09 DIAGNOSIS — Z87891 Personal history of nicotine dependence: Secondary | ICD-10-CM | POA: Diagnosis not present

## 2017-11-09 LAB — CBC
HEMATOCRIT: 37.1 % (ref 36.0–46.0)
HEMOGLOBIN: 11.6 g/dL — AB (ref 12.0–15.0)
MCH: 25.6 pg — ABNORMAL LOW (ref 26.0–34.0)
MCHC: 31.3 g/dL (ref 30.0–36.0)
MCV: 81.7 fL (ref 78.0–100.0)
Platelets: 255 10*3/uL (ref 150–400)
RBC: 4.54 MIL/uL (ref 3.87–5.11)
RDW: 13.6 % (ref 11.5–15.5)
WBC: 6.3 10*3/uL (ref 4.0–10.5)

## 2017-11-09 LAB — COMPREHENSIVE METABOLIC PANEL
ALT: 25 U/L (ref 14–54)
ANION GAP: 9 (ref 5–15)
AST: 24 U/L (ref 15–41)
Albumin: 3.6 g/dL (ref 3.5–5.0)
Alkaline Phosphatase: 73 U/L (ref 38–126)
BUN: 17 mg/dL (ref 6–20)
CHLORIDE: 105 mmol/L (ref 101–111)
CO2: 23 mmol/L (ref 22–32)
Calcium: 8.7 mg/dL — ABNORMAL LOW (ref 8.9–10.3)
Creatinine, Ser: 1.04 mg/dL — ABNORMAL HIGH (ref 0.44–1.00)
Glucose, Bld: 94 mg/dL (ref 65–99)
POTASSIUM: 3.9 mmol/L (ref 3.5–5.1)
Sodium: 137 mmol/L (ref 135–145)
TOTAL PROTEIN: 7.9 g/dL (ref 6.5–8.1)
Total Bilirubin: 0.2 mg/dL — ABNORMAL LOW (ref 0.3–1.2)

## 2017-11-09 LAB — URINALYSIS, ROUTINE W REFLEX MICROSCOPIC
Bilirubin Urine: NEGATIVE
GLUCOSE, UA: NEGATIVE mg/dL
Hgb urine dipstick: NEGATIVE
Ketones, ur: NEGATIVE mg/dL
LEUKOCYTES UA: NEGATIVE
NITRITE: NEGATIVE
PH: 7 (ref 5.0–8.0)
Protein, ur: NEGATIVE mg/dL
SPECIFIC GRAVITY, URINE: 1.025 (ref 1.005–1.030)

## 2017-11-09 LAB — LIPASE, BLOOD: LIPASE: 28 U/L (ref 11–51)

## 2017-11-09 MED ORDER — BISMUTH SUBSALICYLATE 262 MG PO CHEW
524.0000 mg | CHEWABLE_TABLET | Freq: Once | ORAL | Status: DC
  Filled 2017-11-09: qty 2

## 2017-11-09 MED ORDER — DICYCLOMINE HCL 10 MG PO CAPS
10.0000 mg | ORAL_CAPSULE | Freq: Once | ORAL | Status: AC
  Administered 2017-11-10: 10 mg via ORAL
  Filled 2017-11-09: qty 1

## 2017-11-09 NOTE — ED Triage Notes (Signed)
Pt states she has had a "stomach ache" for 2 days. Denies N/V/D/, GU sx, bloody stools. Tylenol helped minimally. Pt states she has been eating a lot of spicy foods lately.

## 2017-11-10 LAB — POC URINE PREG, ED: Preg Test, Ur: NEGATIVE

## 2017-11-10 MED ORDER — DICYCLOMINE HCL 20 MG PO TABS: 20.0000 mg | ORAL_TABLET | Freq: Three times a day (TID) | ORAL | 0 refills | Status: DC | PRN

## 2017-11-10 NOTE — Discharge Instructions (Signed)
You were seen in the emergency department today for abdominal pain.   Your lab work was fairly consistent with previous lab work that you have had done. Of note your creatine (test for kidney function) was elevated at 1.04 (normal is .44-1.0) You will need to have this rechecked by your primary care provider. Your blood pressure was also elevated in the emergency department today you will need to have this rechecked as well.   You received Bentyl in the emergency department.  I have also prescribed you a prescription for Bentyl to take as needed for abdominal discomfort.  You will need to follow-up with your primary care provider or the GI doctor that I provided in your discharge instructions within the next 1 week for reevaluation.  I have also listed local primary care providers contact information below.  Return to the emergency department for any new or worsening symptoms including but not limited to worsening pain, fever, blood in your stool, or inability to keep down food or fluids.

## 2017-11-10 NOTE — ED Provider Notes (Signed)
[facility] EMERGENCY DEPARTMENT Provider Note   CSN: [id number] Arrival date & time: 11/09/17  2203     History   Chief Complaint Chief Complaint  Patient presents with  . Abdominal Pain    HPI Theresa Becker is a 31 y.o. female  With a history of hypertension (not on medications) presents to the emergency department complaining of intermittent abdominal discomfort over the past 2 days.  Patient describes the pain as being achy and to the diffuse abdomen.  Discomfort is triggered by stress, has not occurred without stress, lasts for approximately 30-45 minutes when present then resolves.  Has been relieved by Tylenol.  Other than stress and Tylenol no other alleviating or aggravating factors.  Patient had brief nausea earlier today, otherwise no nausea.  Denies vomiting, diarrhea, constipation, blood in stool, fever, chills, dysuria, vaginal bleeding, or vaginal discharge.  Last menstrual period was December 6, patient has 3-week cycles.  Patient is sexually active in a monogamous relationship, no concern for STDs at this time.  Currently taking Truvada for HIV prevention, patient has been screened and has not had a diagnosis of HIV and her current sexual partner is not HIV positive.   HPI  Past Medical History:  Diagnosis Date  . Anemia 2017   taking Iron supplements during third trimester  . Bronchitis   . HIV disease (HCC)   . Hypertension    on PO medication  . Obesity     Patient Active Problem List   Diagnosis Date Noted  . Chronic hypertension affecting pregnancy 10/09/2016  . Chronic hypertension with exacerbation during pregnancy in second trimester 07/03/2016  . Supervision of high risk pregnancy, antepartum 04/15/2016  . HIV (human immunodeficiency virus infection) (HCC) 04/15/2016  . Obesity 04/15/2016    Past Surgical History:  Procedure Laterality Date  . NO PAST SURGERIES      OB History    Gravida Para Term Preterm AB Living   2 2 2  0 0 2   SAB TAB  Ectopic Multiple Live Births   0 0 0 0 2       Home Medications    Prior to Admission medications   Medication Sig Start Date End Date Taking? Authorizing Provider  ciprofloxacin (CIPRO) 500 MG tablet Take 1 tablet (500 mg total) by mouth 2 (two) times daily. 12/10/16   Cresenzo-Dishmon, Scarlette CalicoFrances, CNM  ciprofloxacin (CIPRO) 500 MG tablet TAKE 1 TABLET(500 MG) BY MOUTH TWICE DAILY 12/15/16   Cresenzo-Dishmon, Scarlette CalicoFrances, CNM  emtricitabine-tenofovir (TRUVADA) 200-300 MG per tablet Take 1 tablet by mouth at bedtime.     [provider]  Etravirine (INTELENCE) 200 MG TABS Take 200 mg by mouth at bedtime.     [provider]  methocarbamol (ROBAXIN) 500 MG tablet Take 1 tablet (500 mg total) by mouth 2 (two) times daily. 07/14/17   Rancour, Jeannett SeniorStephen, MD  norethindrone (MICRONOR,CAMILA,ERRIN) 0.35 MG tablet Take 1 tablet (0.35 mg total) by mouth daily. 12/10/16   Jacklyn Shellresenzo-Dishmon, Frances, CNM    Family History History reviewed. No pertinent family history.  Social History Social History   Tobacco Use  . Smoking status: Former Smoker    Last attempt to quit: 10/10/2017    Years since quitting: 0.0  . Smokeless tobacco: Never Used  Substance Use Topics  . Alcohol use: No  . Drug use: No     Allergies   Penicillins   Review of Systems Review of Systems  Constitutional: Negative for chills and fever.  Respiratory: Negative  for shortness of breath.   Cardiovascular: Negative for chest pain.  Gastrointestinal: Positive for abdominal pain and nausea (brief, resolved). Negative for blood in stool, constipation, diarrhea and vomiting.  Genitourinary: Negative for dysuria, vaginal bleeding and vaginal discharge.  Musculoskeletal: Negative for back pain.  All other systems reviewed and are negative.   Physical Exam Updated Vital Signs BP (!) 159/94 (BP Location: Left Arm)   Pulse 79   Temp 99.2 F (37.3 C) (Oral)   Resp 16   Ht 5\' 2"  (1.575 m)   Wt 127 kg (280 lb)    LMP 10/20/2017   SpO2 99%   BMI 51.21 kg/m   Physical Exam  Constitutional: She appears well-developed and well-nourished. No distress.  HENT:  Head: Normocephalic and atraumatic.  Eyes: Conjunctivae are normal. Right eye exhibits no discharge. Left eye exhibits no discharge.  Cardiovascular: Normal rate and regular rhythm.  No murmur heard. Pulmonary/Chest: Breath sounds normal. No respiratory distress. She has no wheezes. She has no rales.  Abdominal: Soft. Bowel sounds are normal. She exhibits no distension. There is tenderness (mild, diffuse, no focal tenderness). There is no rigidity, no rebound, no guarding, no CVA tenderness, no tenderness at McBurney's point and negative Murphy's sign.  Neurological: She is alert.  Clear speech.   Skin: Skin is warm and dry. No rash noted.  Psychiatric: She has a normal mood and affect. Her behavior is normal.  Nursing note and vitals reviewed.   ED Treatments / Results  Labs Results for orders placed or performed during the hospital encounter of 11/09/17  Lipase, blood  Result Value Ref Range   Lipase 28 11 - 51 U/L  Comprehensive metabolic panel  Result Value Ref Range   Sodium 137 135 - 145 mmol/L   Potassium 3.9 3.5 - 5.1 mmol/L   Chloride 105 101 - 111 mmol/L   CO2 23 22 - 32 mmol/L   Glucose, Bld 94 65 - 99 mg/dL   BUN 17 6 - 20 mg/dL   Creatinine, Ser 1.61 (H) 0.44 - 1.00 mg/dL   Calcium 8.7 (L) 8.9 - 10.3 mg/dL   Total Protein 7.9 6.5 - 8.1 g/dL   Albumin 3.6 3.5 - 5.0 g/dL   AST 24 15 - 41 U/L   ALT 25 14 - 54 U/L   Alkaline Phosphatase 73 38 - 126 U/L   Total Bilirubin 0.2 (L) 0.3 - 1.2 mg/dL   GFR calc non Af Amer >60 >60 mL/min   GFR calc Af Amer >60 >60 mL/min   Anion gap 9 5 - 15  CBC  Result Value Ref Range   WBC 6.3 4.0 - 10.5 K/uL   RBC 4.54 3.87 - 5.11 MIL/uL   Hemoglobin 11.6 (L) 12.0 - 15.0 g/dL   HCT 09.6 04.5 - 40.9 %   MCV 81.7 78.0 - 100.0 fL   MCH 25.6 (L) 26.0 - 34.0 pg   MCHC 31.3 30.0 - 36.0  g/dL   RDW 81.1 91.4 - 78.2 %   Platelets 255 150 - 400 K/uL  Urinalysis, Routine w reflex microscopic  Result Value Ref Range   Color, Urine YELLOW YELLOW   APPearance CLEAR CLEAR   Specific Gravity, Urine 1.025 1.005 - 1.030   pH 7.0 5.0 - 8.0   Glucose, UA NEGATIVE NEGATIVE mg/dL   Hgb urine dipstick NEGATIVE NEGATIVE   Bilirubin Urine NEGATIVE NEGATIVE   Ketones, ur NEGATIVE NEGATIVE mg/dL   Protein, ur NEGATIVE NEGATIVE mg/dL   Nitrite NEGATIVE  NEGATIVE   Leukocytes, UA NEGATIVE NEGATIVE   POC urine preg NEGATIVE- confirmed this with RN Herbert SetaHeather Long due to difficulty entering this electronically.   No results found. EKG  EKG Interpretation None       Radiology No results found.  Procedures Procedures (including critical care time)  Medications Ordered in ED Medications  bismuth subsalicylate (PEPTO BISMOL) chewable tablet 524 mg (not administered)  dicyclomine (BENTYL) capsule 10 mg (10 mg Oral Given 11/10/17 0007)     Initial Impression / Assessment and Plan / ED Course  I have reviewed the triage vital signs and the nursing notes.  Pertinent labs & imaging results that were available during my care of the patient were reviewed by me and considered in my medical decision making (see chart for details).  Patient presents with intermittent abdominal pain triggered by stress. She is nontoxic appearing, in no apparent distress, with stable vital signs.  On exam she has very mild diffuse tenderness, no rebound/rigidity/guarding, no focal tenderness specifically no McBurney's point tenderness, negative Murphy sign.  Screening labs revealed slightly elevated creatinine at 1.04, this is elevated from patient's previous.  Patient with hemoglobin of 11.6, this is consistent with patient's baseline.  Of note no leukocytosis, patient is afebrile.  Will treat with Bentyl and re- evaluate.  12:50PM: Re-eval: Patient states she is feeling better, ready to go home.  Patient is  nontoxic, nonseptic appearing, in no apparent distress. Patient does not meet the SIRS or Sepsis criteria.  On repeat exam patient does not have a surgical abdomen and there are no peritoneal signs.  No indication of appendicitis, bowel obstruction, bowel perforation, cholecystitis, diverticulitis, pancreatitis, cystitis, pyelonephritis, PID or ectopic pregnancy.  Will treat symptomatically with Bentyl and PCP and/or GI follow-up.  Additionally patient's blood pressure was elevated in the emergency department today.  I discussed this with her and the need to have this rechecked by her primary care provider in order to determine if initiation of antihypertensive medications is necessary.  No headache, change in vision, numbness, weakness, chest pain, or dyspnea. Also discussed need for recheck of creatinine. I discussed results, treatment plan, need for PCP/GI follow-up, and return precautions with the patient. Provided opportunity for questions, patient confirmed understanding and is in agreement with plan.   Final Clinical Impressions(s) / ED Diagnoses   Final diagnoses:  Generalized abdominal pain    ED Discharge Orders        Ordered    dicyclomine (BENTYL) 20 MG tablet  3 times daily PRN     11/10/17 0055       Greogry Goodwyn, Pleas KochSamantha R, PA-C 11/10/17 0108    Devoria AlbeKnapp, Iva, MD 11/10/17 0130

## 2018-01-04 ENCOUNTER — Other Ambulatory Visit: Payer: Self-pay | Admitting: Advanced Practice Midwife

## 2018-02-05 ENCOUNTER — Other Ambulatory Visit: Payer: Self-pay | Admitting: Advanced Practice Midwife

## 2018-04-08 ENCOUNTER — Ambulatory Visit: Payer: Self-pay | Admitting: Adult Health

## 2018-06-29 ENCOUNTER — Encounter: Payer: Self-pay | Admitting: Obstetrics and Gynecology

## 2018-06-29 ENCOUNTER — Ambulatory Visit (INDEPENDENT_AMBULATORY_CARE_PROVIDER_SITE_OTHER): Payer: Medicaid Other | Admitting: Obstetrics and Gynecology

## 2018-06-29 VITALS — BP 135/92 | HR 82 | Ht 62.0 in | Wt 296.0 lb

## 2018-06-29 DIAGNOSIS — Z113 Encounter for screening for infections with a predominantly sexual mode of transmission: Secondary | ICD-10-CM | POA: Diagnosis not present

## 2018-06-29 DIAGNOSIS — N898 Other specified noninflammatory disorders of vagina: Secondary | ICD-10-CM

## 2018-06-29 DIAGNOSIS — B9689 Other specified bacterial agents as the cause of diseases classified elsewhere: Secondary | ICD-10-CM | POA: Diagnosis not present

## 2018-06-29 DIAGNOSIS — N760 Acute vaginitis: Secondary | ICD-10-CM

## 2018-06-29 LAB — POCT WET PREP (WET MOUNT): Trichomonas Wet Prep HPF POC: ABSENT

## 2018-06-29 MED ORDER — METRONIDAZOLE 500 MG PO TABS: 500.0000 mg | ORAL_TABLET | Freq: Two times a day (BID) | ORAL | 1 refills | Status: DC

## 2018-06-29 NOTE — Progress Notes (Signed)
Patient ID: Theresa ShaggySheena C Cruise, female   DOB: [birth year], 32 y.o.   MRN: [id number]    [facility]  @DATE @            Patient name: Theresa Becker MRN [id number]  Date of birth: [birth year]  CC & HPI:  Theresa ShaggySheena C Vanderhoff is a 32 y.o. female presenting today patient has left in her tampon but finally came out 2 days ago while at work. There was odor but no longer has any odor. [place] in [place] in March after her 3rd HIV test came back with question mark reading and was told to follow back up in 3 weeks. Her test results showed negative reading and has since tested negative.  ROS:  ROS +HIV +vaginal odor -fever -chills All systems are negative except as noted in the HPI and PMH.  Pertinent History Reviewed:   Reviewed: Significant for tested HIV neg for 8 years Medical         Past Medical History:  Diagnosis Date  . Anemia 2017   taking Iron supplements during third trimester  . Bronchitis   . HIV disease (HCC)   . Hypertension    on PO medication  . Obesity                               Surgical Hx:    Past Surgical History:  Procedure Laterality Date  . NO PAST SURGERIES     Medications: Reviewed & Updated - see associated section                       Current Outpatient Medications:  .  emtricitabine-tenofovir (TRUVADA) 200-300 MG per tablet, Take 1 tablet by mouth at bedtime. , Disp: , Rfl:  .  Etravirine (INTELENCE) 200 MG TABS, Take 200 mg by mouth at bedtime. , Disp: , Rfl:    Social History: Reviewed -  reports that she quit smoking about 8 months ago. She has never used smokeless tobacco. Is no longer in relationship with person who exposed her to HIV  Objective Findings:  Vitals: Blood pressure (!) 135/92, pulse 82, height 5\' 2"  (1.575 m), weight 296 lb (134.3 kg), last menstrual period 06/14/2018, unknown if currently breastfeeding.  PHYSICAL EXAMINATION General appearance - alert, well appearing, and in no distress, oriented to person,  place, and time and overweight Mental status - alert, oriented to person, place, and time, normal mood, behavior, speech, dress, motor activity, and thought processes, affect appropriate to mood  PELVIC Vagina - normal apprearing, odor Cervix - normal Uterus - normal Adnexa - Negative [place] - Clue cell, white cells, negative trick, positive whiff, negative for yeast.  Assessment & Plan:   A:  1.  Bacterial vaginosis (BV) 2. [facility] collected  P:  1.  Metronidazole tablets 2.  LE: GC and Chl are both positive, will have office contact pt. 3. Doxycycline called in.    By signing my name below, I, Arnette NorrisMari Johnson, attest that this documentation has been prepared under the direction and in the presence of Tilda BurrowFerguson, Juelz Whittenberg V, MD. Electronically Signed: Arnette NorrisMari Johnson Medical Scribe. 06/29/18. 10:05 AM.  I personally performed the services described in this documentation, which was SCRIBED in my presence. The recorded information has been reviewed and considered accurate. It has been edited as necessary during review. Tilda BurrowJohn V Woods Gangemi, MD

## 2018-07-01 LAB — GC/CHLAMYDIA PROBE AMP
Chlamydia trachomatis, NAA: POSITIVE — AB
Neisseria gonorrhoeae by PCR: POSITIVE — AB

## 2018-07-01 MED ORDER — DOXYCYCLINE HYCLATE 100 MG PO CAPS: 100.0000 mg | ORAL_CAPSULE | Freq: Two times a day (BID) | ORAL | 1 refills | Status: DC

## 2018-07-02 ENCOUNTER — Encounter: Payer: Self-pay | Admitting: Obstetrics and Gynecology

## 2018-07-02 ENCOUNTER — Telehealth: Payer: Self-pay | Admitting: Obstetrics and Gynecology

## 2018-07-02 DIAGNOSIS — A549 Gonococcal infection, unspecified: Secondary | ICD-10-CM | POA: Insufficient documentation

## 2018-07-02 DIAGNOSIS — A749 Chlamydial infection, unspecified: Secondary | ICD-10-CM | POA: Insufficient documentation

## 2018-07-02 NOTE — Telephone Encounter (Signed)
texted pt to have them contact office.

## 2018-07-04 ENCOUNTER — Telehealth: Payer: Self-pay | Admitting: *Deleted

## 2018-07-04 NOTE — Telephone Encounter (Signed)
Spoke to patient's mother who stated patient was not available and did not have a number to reach her at.  Will tell the patient to call our office.

## 2018-07-05 ENCOUNTER — Encounter: Payer: Self-pay | Admitting: *Deleted

## 2018-07-05 NOTE — Telephone Encounter (Signed)
Unable to leave message, mychart message sent.

## 2018-07-06 ENCOUNTER — Telehealth: Payer: Self-pay | Admitting: *Deleted

## 2018-07-06 NOTE — Telephone Encounter (Signed)
Pt called stating that [facility] did not receive the prescription for doxycycline. Advised that I would call the prescription in for her. [place] [facility] [place].

## 2018-07-07 ENCOUNTER — Telehealth: Payer: Self-pay | Admitting: *Deleted

## 2018-07-07 ENCOUNTER — Encounter: Payer: Self-pay | Admitting: *Deleted

## 2018-07-07 NOTE — Telephone Encounter (Signed)
Unable to reach patient. Will send certified letter 

## 2018-07-21 ENCOUNTER — Ambulatory Visit: Payer: Medicaid Other | Admitting: Obstetrics and Gynecology

## 2018-08-01 ENCOUNTER — Ambulatory Visit: Payer: Medicaid Other | Admitting: Obstetrics and Gynecology

## 2018-08-01 ENCOUNTER — Encounter: Payer: Self-pay | Admitting: *Deleted

## 2018-08-23 ENCOUNTER — Telehealth: Payer: Self-pay | Admitting: *Deleted

## 2018-08-23 NOTE — Telephone Encounter (Signed)
Both numbers disconnected

## 2018-12-17 IMAGING — DX DG LUMBAR SPINE COMPLETE 4+V
5 series · 6 of 6 positions shown · non-contrast
Comparison: Lumbar spine radiographs October 24, 2008

CLINICAL DATA: Struck by a machine at work.  Back pain.

EXAM:
LUMBAR SPINE - COMPLETE 4+ VIEW

[l-spine ap]
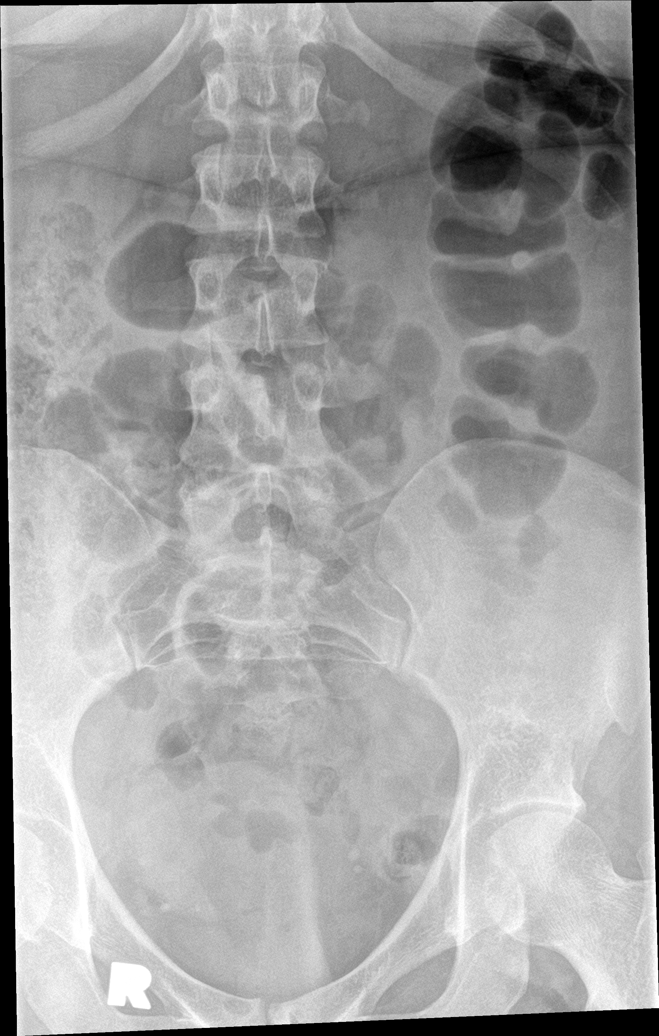

[l-spine obl (1 of 2)]
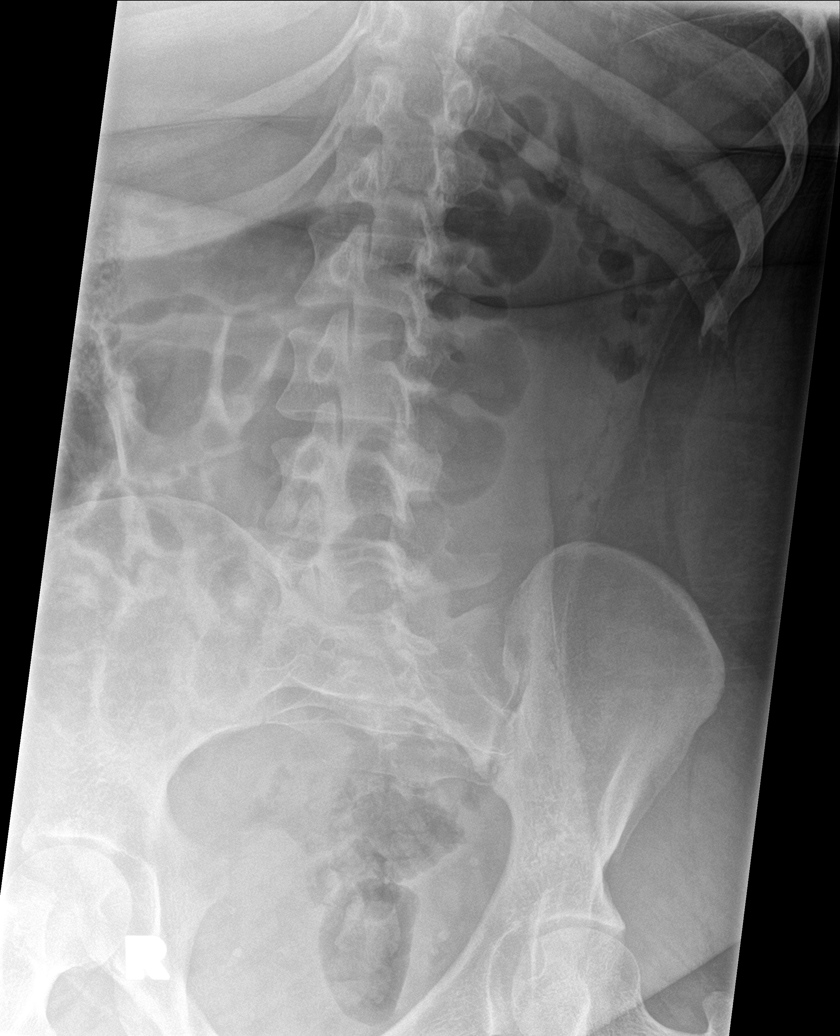

[l-spine obl (2 of 2)]
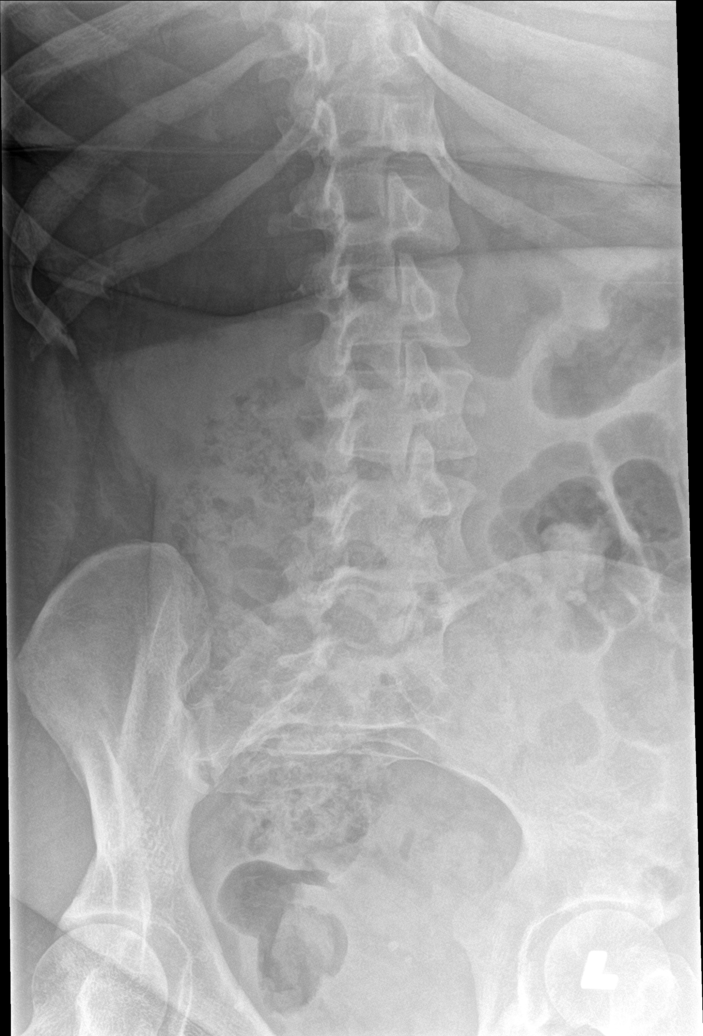

[Series 4: l-spine lat · 0.14mm/px · 2 of 2 slices shown]
[im 1/2]
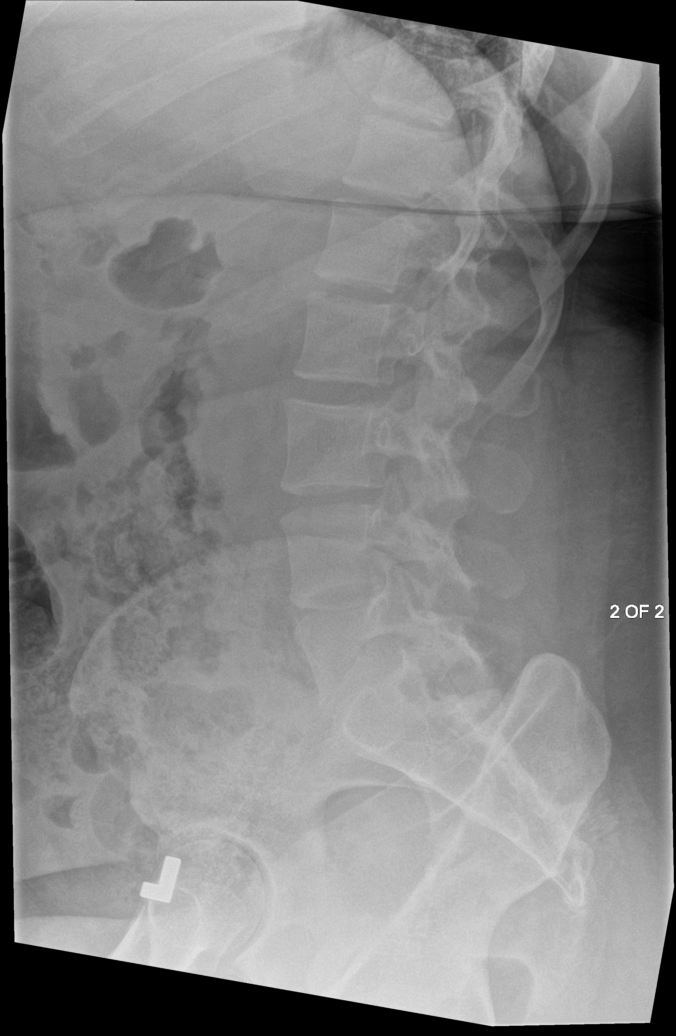
[im 2/2]
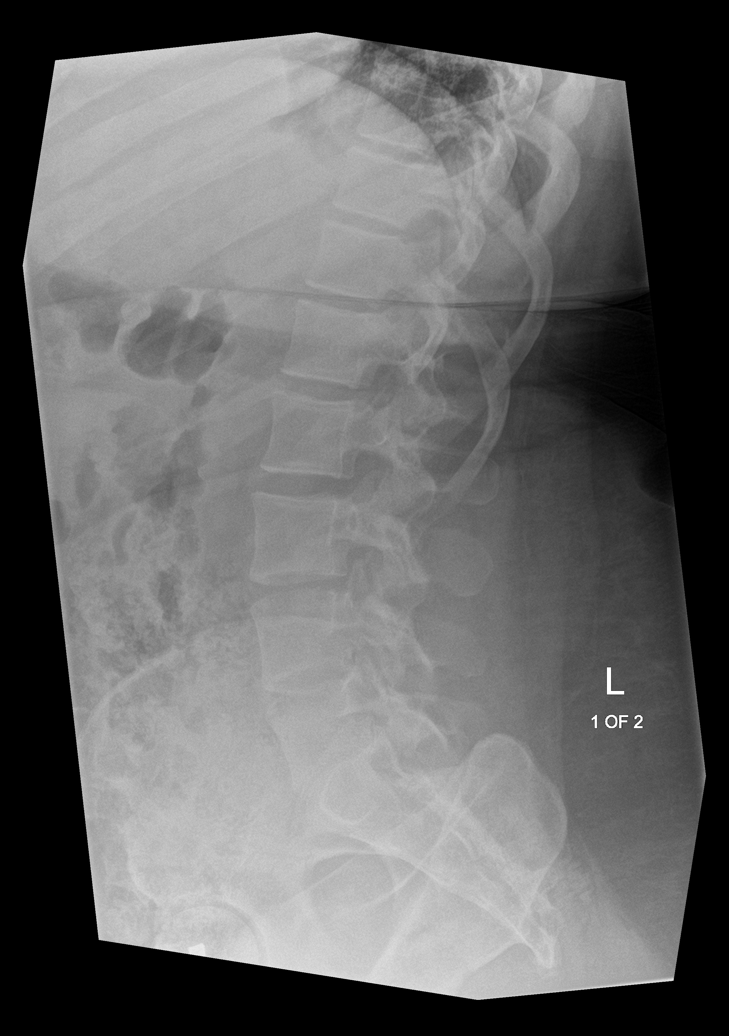

[l-spine spot]
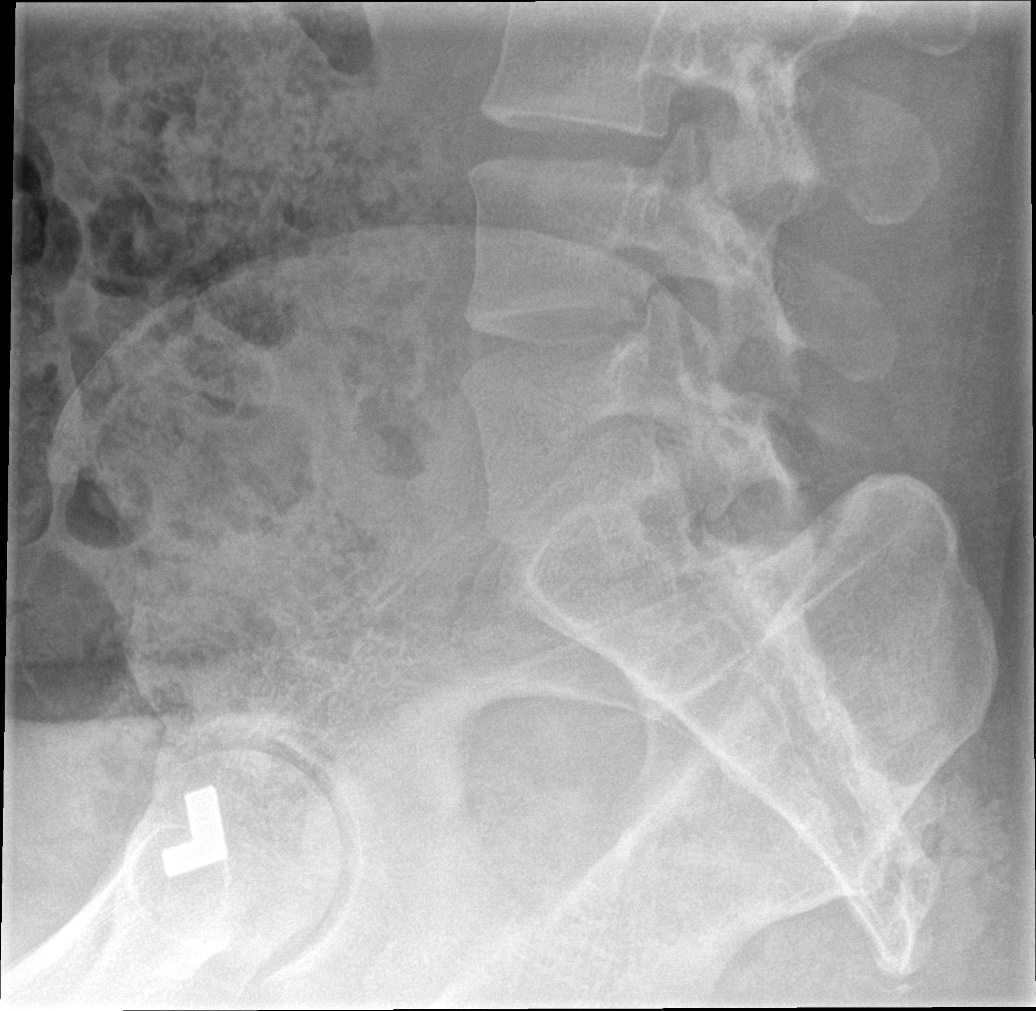

[6 of 6 positions shown; findings below may reference images not displayed]

FINDINGS: Five non rib-bearing lumbar-type vertebral bodies are intact and
aligned with maintenance of the lumbar lordosis. Intervertebral disc
heights are normal. No destructive bony lesions. Symmetric iliac
bone exostoses.

Sacroiliac joints are symmetric. Included prevertebral and
paraspinal soft tissue planes are non-suspicious. Phleboliths
project in the pelvis.
IMPRESSION: Negative.

## 2019-11-03 ENCOUNTER — Ambulatory Visit: Payer: Medicaid Other | Attending: Internal Medicine

## 2019-11-03 ENCOUNTER — Other Ambulatory Visit: Payer: Self-pay

## 2019-11-03 DIAGNOSIS — Z20822 Contact with and (suspected) exposure to covid-19: Secondary | ICD-10-CM

## 2019-11-04 LAB — NOVEL CORONAVIRUS, NAA: SARS-CoV-2, NAA: DETECTED — AB

## 2019-11-07 ENCOUNTER — Telehealth: Payer: Self-pay

## 2019-11-07 NOTE — Telephone Encounter (Signed)
Estill Bamberg from [facility] called to get a new phone number for pt to give Covid test results. Numbers on file were given.   [place]

## 2019-11-13 ENCOUNTER — Ambulatory Visit
Admission: EM | Admit: 2019-11-13 | Discharge: 2019-11-13 | Disposition: A | Discharge status: Home / Self Care | Payer: Medicaid Other | Attending: Emergency Medicine | Admitting: Emergency Medicine

## 2019-11-13 ENCOUNTER — Other Ambulatory Visit: Payer: Self-pay

## 2019-11-13 DIAGNOSIS — N898 Other specified noninflammatory disorders of vagina: Secondary | ICD-10-CM | POA: Insufficient documentation

## 2019-11-13 LAB — POCT URINALYSIS DIP (MANUAL ENTRY)
Bilirubin, UA: NEGATIVE
Blood, UA: NEGATIVE
Glucose, UA: NEGATIVE mg/dL
Ketones, POC UA: NEGATIVE mg/dL
Nitrite, UA: NEGATIVE
Protein Ur, POC: NEGATIVE mg/dL
Spec Grav, UA: 1.025 (ref 1.010–1.025)
Urobilinogen, UA: 0.2 E.U./dL
pH, UA: 6 (ref 5.0–8.0)

## 2019-11-13 MED ORDER — FLUCONAZOLE 200 MG PO TABS: 200.0000 mg | ORAL_TABLET | Freq: Every day | ORAL | 0 refills | Status: AC

## 2019-11-13 NOTE — ED Triage Notes (Signed)
Pt presents to UC w/ c/o vaginal irritation x3 days. Pt has used metronidazole x2 days w/ little relief. Pt denies foul odor or discharge. Pt has protected sex w/ condom.

## 2019-11-13 NOTE — Discharge Instructions (Addendum)
UA showed trace of white blood cell.  Will send for culture Urine cytology/ vaginal self swab/ cervical swab obtained Prescribed diflucan 200 mg once daily and then second dose 72 hours later Take medications as prescribed and to completion We will follow up with you regarding the results of your test If tests are positive, please abstain from sexual activity for at least 7 days and notify partners Follow up with PCP if symptoms persists Return here or go to ER if you have any new or worsening symptoms

## 2019-11-13 NOTE — ED Provider Notes (Signed)
[facility]    CSN: [id number] Arrival date & time: 11/13/19  1245      History   Chief Complaint Chief Complaint  Patient presents with  . covid positive/ vaginal irritation    HPI Theresa Becker is a 33 y.o. female.   Theresa Becker is a 33 y.o. female who presents with complaints of abrupt vaginal irritation for the pasdt 2  days.  She denies a precipitating event, recent sexual encounter or recent antibiotic use.  Patient is sexually active with 1 of female partners.  Denies vaginal discharge.  She has tried flaggyl without relief.  She reports worsening symptoms with urination.  She reports similar symptoms in the past and was diagnosed with bacterial vaginosis and was treated .  She denies fever, chills, nausea, vomiting, abdominal or pelvic pain, urinary symptoms, vaginal itching, vaginal odor, vaginal bleeding, dyspareunia, vaginal rashes or lesions.   The history is provided by the patient. No language interpreter was used.    Past Medical History:  Diagnosis Date  . Anemia 2017   taking Iron supplements during third trimester  . Bronchitis   . HIV disease ([place])   . Hypertension    on PO medication  . Obesity     Patient Active Problem List   Diagnosis Date Noted  . Gonorrhea 07/02/2018  . Chlamydia infection 07/02/2018  . Chronic hypertension with exacerbation during pregnancy in second trimester 07/03/2016  . HIV (human immunodeficiency virus infection) ([place]) 04/15/2016  . Obesity 04/15/2016    Past Surgical History:  Procedure Laterality Date  . NO PAST SURGERIES      OB History    Gravida  2   Para  2   Term  2   Preterm  0   AB  0   Living  2     SAB  0   TAB  0   Ectopic  0   Multiple  0   Live Births  2            Home Medications    Prior to Admission medications   Medication Sig Start Date End Date Taking? Authorizing Provider  doxycycline (VIBRAMYCIN) 100 MG capsule Take 1 capsule (100 mg total) by mouth  2 (two) times daily. 07/01/18   Jonnie Kind, MD  emtricitabine-tenofovir (TRUVADA) 200-300 MG per tablet Take 1 tablet by mouth at bedtime.     [provider]  Etravirine (INTELENCE) 200 MG TABS Take 200 mg by mouth at bedtime.     [provider]  metroNIDAZOLE (FLAGYL) 500 MG tablet Take 1 tablet (500 mg total) by mouth 2 (two) times daily. 06/29/18   Jonnie Kind, MD    Family History Family History  Problem Relation Age of Onset  . Healthy Mother   . Healthy Father     Social History Social History   Tobacco Use  . Smoking status: Former Smoker    Quit date: 10/10/2017    Years since quitting: 2.0  . Smokeless tobacco: Never Used  Substance Use Topics  . Alcohol use: No  . Drug use: No     Allergies   Penicillins   Review of Systems Review of Systems  Constitutional: Negative.   HENT: Negative.   Respiratory: Negative.   Cardiovascular: Negative.   Genitourinary:       Vaginal irritation  ROS: all other are negatives   Physical Exam Triage Vital Signs ED Triage Vitals  Enc Vitals Group  BP 11/13/19 1302 138/84     Pulse Rate 11/13/19 1302 73     Resp 11/13/19 1302 16     Temp 11/13/19 1302 99.2 F (37.3 C)     Temp Source 11/13/19 1302 Oral     SpO2 11/13/19 1302 97 %     Weight --      Height --      Head Circumference --      Peak Flow --      Pain Score 11/13/19 1306 0     Pain Loc --      Pain Edu? --      Excl. in GC? --    No data found.  Updated Vital Signs BP 138/84 (BP Location: Right Arm)   Pulse 73   Temp 99.2 F (37.3 C) (Oral)   Resp 16   LMP 10/21/2019 (Approximate)   SpO2 97%   Visual Acuity Right Eye Distance:   Left Eye Distance:   Bilateral Distance:    Right Eye Near:   Left Eye Near:    Bilateral Near:     Physical Exam Vitals and nursing note reviewed.  Constitutional:      General: She is not in acute distress.    Appearance: Normal appearance. She is normal weight. She is not  ill-appearing, toxic-appearing or diaphoretic.  Cardiovascular:     Rate and Rhythm: Normal rate and regular rhythm.     Pulses: Normal pulses.     Heart sounds: Normal heart sounds.  Pulmonary:     Effort: Pulmonary effort is normal. No respiratory distress.     Breath sounds: Normal breath sounds. No rhonchi.  Chest:     Chest wall: No tenderness.  Abdominal:     General: Abdomen is flat. Bowel sounds are normal. There is no distension.     Palpations: Abdomen is soft. There is no mass.     Tenderness: There is no abdominal tenderness. There is no right CVA tenderness, left CVA tenderness, guarding or rebound.     Hernia: No hernia is present.  Neurological:     Mental Status: She is alert and oriented to person, place, and time.      UC Treatments / Results  Labs (all labs ordered are listed, but only abnormal results are displayed) Labs Reviewed  POCT URINALYSIS DIP (MANUAL ENTRY) - Abnormal; Notable for the following components:      Result Value   Leukocytes, UA Small (1+) (*)    All other components within normal limits  URINE CULTURE  CERVICOVAGINAL ANCILLARY ONLY    EKG   Radiology No results found.  Procedures Procedures (including critical care time)  Medications Ordered in UC Medications - No data to display  Initial Impression / Assessment and Plan / UC Course  I have reviewed the triage vital signs and the nursing notes.  Pertinent labs & imaging results that were available during my care of the patient were reviewed by me and considered in my medical decision making (see chart for details).   Patient stable for discharge.  Benign physical exam.  We will treat patient for a possible yeast infection.  Advised patient to return for worsening of symptoms.  Patient verbalized understanding of the plan of care.   Final Clinical Impressions(s) / UC Diagnoses   Final diagnoses:  Vaginal irritation     Discharge Instructions     UA showed trace of  white blood cell.  Will send for culture Urine cytology/ vaginal self  swab/ cervical swab obtained Prescribed diflucan 200 mg once daily and then second dose 72 hours later Take medications as prescribed and to completion We will follow up with you regarding the results of your test If tests are positive, please abstain from sexual activity for at least 7 days and notify partners Follow up with PCP if symptoms persists Return here or go to ER if you have any new or worsening symptoms       ED Prescriptions    None     PDMP not reviewed this encounter.   Durward Parcel, FNP 11/13/19 1413

## 2019-11-14 LAB — URINE CULTURE: Culture: 50000 — AB

## 2019-11-15 ENCOUNTER — Ambulatory Visit (HOSPITAL_COMMUNITY)
Admission: EM | Admit: 2019-11-15 | Discharge: 2019-11-15 | Disposition: A | Discharge status: Home / Self Care | Payer: Medicaid Other | Attending: Emergency Medicine | Admitting: Emergency Medicine

## 2019-11-15 ENCOUNTER — Telehealth: Payer: Self-pay | Admitting: Emergency Medicine

## 2019-11-15 ENCOUNTER — Ambulatory Visit
Admission: EM | Admit: 2019-11-15 | Discharge: 2019-11-15 | Disposition: A | Discharge status: Home / Self Care | Payer: Medicaid Other

## 2019-11-15 LAB — CERVICOVAGINAL ANCILLARY ONLY
Bacterial vaginitis: NEGATIVE
Candida vaginitis: POSITIVE — AB
Chlamydia: NEGATIVE
Neisseria Gonorrhea: POSITIVE — AB
Trichomonas: NEGATIVE

## 2019-11-15 MED ORDER — GENTAMICIN SULFATE 40 MG/ML IJ SOLN: 240.0000 mg | Freq: Once | INTRAMUSCULAR | Status: DC

## 2019-11-15 MED ORDER — GENTAMICIN SULFATE 40 MG/ML IJ SOLN
240.0000 mg | Freq: Once | INTRAMUSCULAR | Status: DC
  Filled 2019-11-15: qty 6

## 2019-11-15 MED ORDER — AZITHROMYCIN 250 MG PO TABS: 2000.0000 mg | ORAL_TABLET | Freq: Once | ORAL | Status: DC

## 2019-11-15 NOTE — ED Notes (Signed)
Medication was ordered with the understanding patient was receiving treatment but patient went home and said she return in the morning 11/16/2019 to receive treatment.

## 2019-11-15 NOTE — Telephone Encounter (Signed)
Gonorrhea needs treatment. Due to recent changes in CDC guidelines, pt needs only 500mg  IM rocephin.  Candida (yeast) is positive.  Prescription for fluconazole was given at the urgent care visit.    Patient contacted by phone and made aware of    results. Pt verbalized understanding and had all questions answered.

## 2019-11-16 ENCOUNTER — Ambulatory Visit (HOSPITAL_COMMUNITY)
Admission: EM | Admit: 2019-11-16 | Discharge: 2019-11-16 | Disposition: A | Discharge status: Home / Self Care | Payer: Medicaid Other | Attending: Internal Medicine | Admitting: Internal Medicine

## 2019-11-16 ENCOUNTER — Other Ambulatory Visit: Payer: Self-pay

## 2019-11-16 DIAGNOSIS — A549 Gonococcal infection, unspecified: Secondary | ICD-10-CM | POA: Diagnosis not present

## 2019-11-16 MED ORDER — GENTAMICIN SULFATE 40 MG/ML IJ SOLN
240.0000 mg | Freq: Once | INTRAMUSCULAR | Status: AC
  Administered 2019-11-16: 13:00:00 240 mg via INTRAMUSCULAR

## 2019-11-16 MED ORDER — AZITHROMYCIN 250 MG PO TABS
2000.0000 mg | ORAL_TABLET | Freq: Once | ORAL | Status: AC
  Administered 2019-11-16: 13:00:00 2000 mg via ORAL

## 2019-11-16 MED ORDER — AZITHROMYCIN 250 MG PO TABS
ORAL_TABLET | ORAL | Status: AC
  Filled 2019-11-16: qty 8

## 2019-11-16 NOTE — ED Triage Notes (Signed)
Per Dr Mannie Stabile and CDC recommendation pt presents for STD treatment of gonorrhea;  Pt received  240 mg gentamicin because of penicillin allergy 2000 mg zithromax

## 2019-11-17 DIAGNOSIS — B20 Human immunodeficiency virus [HIV] disease: Secondary | ICD-10-CM

## 2019-11-17 HISTORY — DX: Human immunodeficiency virus (HIV) disease: B20

## 2020-01-26 ENCOUNTER — Ambulatory Visit: Payer: Medicaid Other | Attending: Internal Medicine

## 2020-07-19 ENCOUNTER — Other Ambulatory Visit: Payer: Self-pay

## 2020-07-19 ENCOUNTER — Other Ambulatory Visit: Payer: Medicaid Other

## 2020-07-19 DIAGNOSIS — Z20822 Contact with and (suspected) exposure to covid-19: Secondary | ICD-10-CM

## 2020-07-20 LAB — NOVEL CORONAVIRUS, NAA: SARS-CoV-2, NAA: NOT DETECTED

## 2021-09-26 ENCOUNTER — Emergency Department (HOSPITAL_COMMUNITY)
Admission: EM | Admit: 2021-09-26 | Discharge: 2021-09-26 | Disposition: A | Discharge status: Home / Self Care | Payer: Medicaid Other | Attending: Emergency Medicine | Admitting: Emergency Medicine

## 2021-09-26 ENCOUNTER — Encounter (HOSPITAL_COMMUNITY): Payer: Self-pay

## 2021-09-26 ENCOUNTER — Other Ambulatory Visit: Payer: Self-pay

## 2021-09-26 DIAGNOSIS — Z79899 Other long term (current) drug therapy: Secondary | ICD-10-CM | POA: Insufficient documentation

## 2021-09-26 DIAGNOSIS — J019 Acute sinusitis, unspecified: Secondary | ICD-10-CM | POA: Insufficient documentation

## 2021-09-26 DIAGNOSIS — R059 Cough, unspecified: Secondary | ICD-10-CM | POA: Insufficient documentation

## 2021-09-26 DIAGNOSIS — Z21 Asymptomatic human immunodeficiency virus [HIV] infection status: Secondary | ICD-10-CM | POA: Insufficient documentation

## 2021-09-26 DIAGNOSIS — I10 Essential (primary) hypertension: Secondary | ICD-10-CM | POA: Insufficient documentation

## 2021-09-26 DIAGNOSIS — Z87891 Personal history of nicotine dependence: Secondary | ICD-10-CM | POA: Insufficient documentation

## 2021-09-26 MED ORDER — CEFDINIR 300 MG PO CAPS: 300.0000 mg | ORAL_CAPSULE | Freq: Two times a day (BID) | ORAL | 0 refills | Status: DC

## 2021-09-26 NOTE — Discharge Instructions (Addendum)
Contact a health care provider if:  You have a fever.  Your symptoms get worse.  Your symptoms do not improve within 10 days.  Get help right away if:  You have a severe headache.  You have persistent vomiting.  You have severe pain or swelling around your face or eyes.  You have vision problems.  You develop confusion.  Your neck is stiff.  You have trouble breathing.

## 2021-09-26 NOTE — ED Provider Notes (Signed)
[facility] EMERGENCY DEPARTMENT Provider Note   CSN: [id number] Arrival date & time: 09/26/21  4665     History Chief Complaint  Patient presents with   Cough    Theresa Becker is a 35 y.o. female with a past medical history of HIV, hypertension, obesity who presents emergency department with sinusitis.  Patient states that she has been experiencing symptoms for greater than 10 days.  She has been out of work since last Friday due to facial pain, sore throat, ear pain.  She has now developed a slight cough.  She denies fever or chills.She is compliant with her medications.   Cough Associated symptoms: chills, ear pain, headaches and sore throat   Associated symptoms: no fever and no rash       Past Medical History:  Diagnosis Date   Anemia 2017   taking Iron supplements during third trimester   Bronchitis    HIV disease ([facility])    Hypertension    on PO medication   Obesity     Patient Active Problem List   Diagnosis Date Noted   Gonorrhea 07/02/2018   Chlamydia infection 07/02/2018   Chronic hypertension with exacerbation during pregnancy in second trimester 07/03/2016   HIV (human immunodeficiency virus infection) ([facility]) 04/15/2016   Obesity 04/15/2016    Past Surgical History:  Procedure Laterality Date   NO PAST SURGERIES       OB History     Gravida  2   Para  2   Term  2   Preterm  0   AB  0   Living  2      SAB  0   IAB  0   Ectopic  0   Multiple  0   Live Births  2           Family History  Problem Relation Age of Onset   Healthy Mother    Healthy Father     Social History   Tobacco Use   Smoking status: Former    Types: Cigarettes    Quit date: 10/10/2017    Years since quitting: 3.9   Smokeless tobacco: Never  Vaping Use   Vaping Use: Never used  Substance Use Topics   Alcohol use: No   Drug use: No    Home Medications Prior to Admission medications   Medication Sig Start Date End Date Taking? Authorizing  Provider  doxycycline (VIBRAMYCIN) 100 MG capsule Take 1 capsule (100 mg total) by mouth 2 (two) times daily. 07/01/18   Tilda Burrow, MD  emtricitabine-tenofovir (TRUVADA) 200-300 MG per tablet Take 1 tablet by mouth at bedtime.     [provider]  Etravirine 200 MG TABS Take 200 mg by mouth at bedtime.     [provider]  metroNIDAZOLE (FLAGYL) 500 MG tablet Take 1 tablet (500 mg total) by mouth 2 (two) times daily. 06/29/18   Tilda Burrow, MD    Allergies    Penicillins  Review of Systems   Review of Systems  Constitutional:  Positive for activity change, appetite change and chills. Negative for fever.  HENT:  Positive for congestion, ear pain, sinus pressure, sinus pain and sore throat. Negative for trouble swallowing.   Respiratory:  Positive for cough.   Gastrointestinal:  Negative for nausea and vomiting.  Skin:  Negative for rash.  Neurological:  Positive for headaches.   Physical Exam Updated Vital Signs BP (!) 164/89   Pulse 92   Temp  98.2 F (36.8 C)   Resp (!) 89   Ht 5\' 2"  (1.575 m)   Wt 124.7 kg   SpO2 100%   BMI 50.30 kg/m   Physical Exam Vitals and nursing note reviewed.  Constitutional:      General: She is not in acute distress.    Appearance: She is well-developed. She is not diaphoretic.  HENT:     Head: Normocephalic and atraumatic.     Right Ear: Tympanic membrane and external ear normal.     Left Ear: Tympanic membrane and external ear normal.     Nose: Nose normal.     Mouth/Throat:     Mouth: Mucous membranes are moist.     Pharynx: Posterior oropharyngeal erythema present. No oropharyngeal exudate.  Eyes:     General: No scleral icterus.    Conjunctiva/sclera: Conjunctivae normal.     Pupils: Pupils are equal, round, and reactive to light.  Cardiovascular:     Rate and Rhythm: Normal rate and regular rhythm.     Heart sounds: Normal heart sounds. No murmur heard.   No friction rub. No gallop.  Pulmonary:      Effort: Pulmonary effort is normal. No respiratory distress.     Breath sounds: Normal breath sounds.  Abdominal:     General: Bowel sounds are normal. There is no distension.     Palpations: Abdomen is soft. There is no mass.     Tenderness: There is no abdominal tenderness. There is no guarding.  Musculoskeletal:     Cervical back: Normal range of motion.  Lymphadenopathy:     Cervical: No cervical adenopathy.  Skin:    General: Skin is warm and dry.  Neurological:     Mental Status: She is alert and oriented to person, place, and time.  Psychiatric:        Behavior: Behavior normal.    ED Results / Procedures / Treatments   Labs (all labs ordered are listed, but only abnormal results are displayed) Labs Reviewed - No data to display  EKG None  Radiology No results found.  Procedures Procedures   Medications Ordered in ED Medications - No data to display  ED Course  I have reviewed the triage vital signs and the nursing notes.  Pertinent labs & imaging results that were available during my care of the patient were reviewed by me and considered in my medical decision making (see chart for details).    MDM Rules/Calculators/A&P                           Patient with >10 days sxs of sinusitis. Will treat with cefdinir. Follow up with pcp for worsening symptoms and HTN. Discussed return precautions. Final Clinical Impression(s) / ED Diagnoses Final diagnoses:  None    Rx / DC Orders ED Discharge Orders     None        , PA-C 09/26/21 1101    13/11/22, MD 09/27/21 1024

## 2021-09-26 NOTE — ED Triage Notes (Signed)
Reports cough that started 3 days ago.  Reports have felt sick for about a week.  Reports has been around sick children.  Reports took mucinex today.  No sob noted.  Denies fever.

## 2022-02-26 ENCOUNTER — Ambulatory Visit
Admission: EM | Admit: 2022-02-26 | Discharge: 2022-02-26 | Disposition: A | Discharge status: Home / Self Care | Payer: 59 | Attending: Family Medicine | Admitting: Family Medicine

## 2022-02-26 DIAGNOSIS — J4521 Mild intermittent asthma with (acute) exacerbation: Secondary | ICD-10-CM

## 2022-02-26 DIAGNOSIS — J0100 Acute maxillary sinusitis, unspecified: Secondary | ICD-10-CM

## 2022-02-26 MED ORDER — PREDNISONE 20 MG PO TABS: 40.0000 mg | ORAL_TABLET | Freq: Every day | ORAL | 0 refills | Status: DC

## 2022-02-26 MED ORDER — AZITHROMYCIN 250 MG PO TABS: ORAL_TABLET | ORAL | 0 refills | Status: DC

## 2022-02-26 NOTE — ED Triage Notes (Signed)
Pt states she is having a lot of mucus in her nose and throat since last Saturday ? ?Pt states she tried Mucinex without any relief ? ? ?Denies Fever ?

## 2022-02-26 NOTE — ED Provider Notes (Signed)
?[facility] ? ? ? ?CSN: [id number] ?Arrival date & time: 02/26/22  5284 ? ? ?  ? ?History   ?Chief Complaint ?Chief Complaint  ?Patient presents with  ? Sore Throat  ?  Congestion and sore throat  ? ? ?HPI ?Theresa Becker is a 36 y.o. female.  ? ?Presenting today with 1 week history of progressively worsening nasal congestion, sinus pain and pressure, sore throat, hacking cough, wheezing, chest tightness, occasional shortness of breath.  Denies fever, chills, body aches, chest pain, abdominal pain, nausea vomiting or diarrhea.  History of seasonal allergies on daily regimen and asthma on albuterol as needed.  No new sick contacts recently.  COVID and flu testing several days ago was negative per patient. ? ? ?Past Medical History:  ?Diagnosis Date  ? Anemia 2017  ? taking Iron supplements during third trimester  ? Bronchitis   ? HIV disease ([facility])   ? Hypertension   ? on PO medication  ? Obesity   ? ? ?Patient Active Problem List  ? Diagnosis Date Noted  ? Gonorrhea 07/02/2018  ? Chlamydia infection 07/02/2018  ? Chronic hypertension with exacerbation during pregnancy in second trimester 07/03/2016  ? HIV (human immunodeficiency virus infection) ([facility]) 04/15/2016  ? Obesity 04/15/2016  ? ? ?Past Surgical History:  ?Procedure Laterality Date  ? NO PAST SURGERIES    ? ? ?OB History   ? ? Gravida  ?2  ? Para  ?2  ? Term  ?2  ? Preterm  ?0  ? AB  ?0  ? Living  ?2  ?  ? ? SAB  ?0  ? IAB  ?0  ? Ectopic  ?0  ? Multiple  ?0  ? Live Births  ?2  ?   ?  ?  ? ? ? ?Home Medications   ? ?Prior to Admission medications   ?Medication Sig Start Date End Date Taking? Authorizing Provider  ?azithromycin (ZITHROMAX) 250 MG tablet Take first 2 tablets together, then 1 every day until finished. 02/26/22  Yes Particia Nearing, PA-C  ?predniSONE (DELTASONE) 20 MG tablet Take 2 tablets (40 mg total) by mouth daily with breakfast. 02/26/22  Yes Particia Nearing, PA-C  ?cefdinir (OMNICEF) 300 MG capsule Take 1 capsule (300  mg total) by mouth 2 (two) times daily. 09/26/21   Arthor Captain, PA-C  ?emtricitabine-tenofovir (TRUVADA) 200-300 MG per tablet Take 1 tablet by mouth at bedtime.     [provider]  ?Etravirine 200 MG TABS Take 200 mg by mouth at bedtime.     [provider]  ? ? ?Family History ?Family History  ?Problem Relation Age of Onset  ? Healthy Mother   ? Healthy Father   ? ? ?Social History ?Social History  ? ?Tobacco Use  ? Smoking status: Former  ?  Types: Cigarettes  ?  Quit date: 10/10/2017  ?  Years since quitting: 4.3  ? Smokeless tobacco: Never  ?Vaping Use  ? Vaping Use: Never used  ?Substance Use Topics  ? Alcohol use: No  ? Drug use: No  ? ? ? ?Allergies   ?Penicillins ? ? ?Review of Systems ?Review of Systems ?Per HPI ? ?Physical Exam ?Triage Vital Signs ?ED Triage Vitals  ?Enc Vitals Group  ?   BP 02/26/22 1021 (!) 173/100  ?   Pulse Rate 02/26/22 1021 71  ?   Resp 02/26/22 1021 18  ?   Temp 02/26/22 1021 (!) 97.4 ?F (36.3 ?C)  ?  Temp Source 02/26/22 1021 Oral  ?   SpO2 02/26/22 1021 97 %  ?   Weight --   ?   Height --   ?   Head Circumference --   ?   Peak Flow --   ?   Pain Score 02/26/22 1019 0  ?   Pain Loc --   ?   Pain Edu? --   ?   Excl. in GC? --   ? ?No data found. ? ?Updated Vital Signs ?BP (!) 173/100 (BP Location: Right Arm)   Pulse 71   Temp (!) 97.4 ?F (36.3 ?C) (Oral)   Resp 18   LMP 02/19/2022 (Exact Date)   SpO2 97%   Breastfeeding No  ? ?Visual Acuity ?Right Eye Distance:   ?Left Eye Distance:   ?Bilateral Distance:   ? ?Right Eye Near:   ?Left Eye Near:    ?Bilateral Near:    ? ?Physical Exam ?Vitals and nursing note reviewed.  ?Constitutional:   ?   Appearance: Normal appearance.  ?HENT:  ?   Head: Atraumatic.  ?   Right Ear: Tympanic membrane and external ear normal.  ?   Left Ear: Tympanic membrane and external ear normal.  ?   Nose: Congestion present.  ?   Mouth/Throat:  ?   Mouth: Mucous membranes are moist.  ?   Pharynx: Posterior oropharyngeal erythema  present.  ?Eyes:  ?   Extraocular Movements: Extraocular movements intact.  ?   Conjunctiva/sclera: Conjunctivae normal.  ?Cardiovascular:  ?   Rate and Rhythm: Normal rate and regular rhythm.  ?   Heart sounds: Normal heart sounds.  ?Pulmonary:  ?   Effort: Pulmonary effort is normal.  ?   Breath sounds: Wheezing present. No rales.  ?Musculoskeletal:     ?   General: Normal range of motion.  ?   Cervical back: Normal range of motion and neck supple.  ?Skin: ?   General: Skin is warm and dry.  ?Neurological:  ?   Mental Status: She is alert and oriented to person, place, and time.  ?Psychiatric:     ?   Mood and Affect: Mood normal.     ?   Thought Content: Thought content normal.  ? ? ? ?UC Treatments / Results  ?Labs ?(all labs ordered are listed, but only abnormal results are displayed) ?Labs Reviewed - No data to display ? ?EKG ? ? ?Radiology ?No results found. ? ?Procedures ?Procedures (including critical care time) ? ?Medications Ordered in UC ?Medications - No data to display ? ?Initial Impression / Assessment and Plan / UC Course  ?I have reviewed the triage vital signs and the nursing notes. ? ?Pertinent labs & imaging results that were available during my care of the patient were reviewed by me and considered in my medical decision making (see chart for details). ? ?  ? ?Treat with azithromycin, prednisone, continued albuterol, Mucinex, allergy regimen.  Return for worsening symptoms. ? ?Final Clinical Impressions(s) / UC Diagnoses  ? ?Final diagnoses:  ?Acute maxillary sinusitis, recurrence not specified  ?Mild intermittent asthma with acute exacerbation  ? ?Discharge Instructions   ?None ?  ? ?ED Prescriptions   ? ? Medication Sig Dispense Auth. Provider  ? azithromycin (ZITHROMAX) 250 MG tablet Take first 2 tablets together, then 1 every day until finished. 6 tablet Particia Nearing, [place]  ? predniSONE (DELTASONE) 20 MG tablet Take 2 tablets (40 mg total) by mouth daily with breakfast. 10 tablet  Particia NearingLane, Carrington Mullenax Elizabeth, PA-C  ? ?  ? ?PDMP not reviewed this encounter. ?  ?Particia NearingLane, Anyela Napierkowski Elizabeth, PA-C ?02/26/22 1115 ? ?

## 2022-07-10 ENCOUNTER — Encounter: Payer: Self-pay | Admitting: Emergency Medicine

## 2022-07-10 ENCOUNTER — Ambulatory Visit
Admission: EM | Admit: 2022-07-10 | Discharge: 2022-07-10 | Disposition: A | Discharge status: Home / Self Care | Payer: 59 | Attending: Family Medicine | Admitting: Family Medicine

## 2022-07-10 DIAGNOSIS — K0889 Other specified disorders of teeth and supporting structures: Secondary | ICD-10-CM | POA: Diagnosis not present

## 2022-07-10 MED ORDER — CLINDAMYCIN HCL 300 MG PO CAPS: 300.0000 mg | ORAL_CAPSULE | Freq: Two times a day (BID) | ORAL | 0 refills | Status: DC

## 2022-07-10 MED ORDER — CHLORHEXIDINE GLUCONATE 0.12 % MT SOLN: 15.0000 mL | Freq: Two times a day (BID) | OROMUCOSAL | 0 refills | Status: DC

## 2022-07-10 NOTE — ED Provider Notes (Signed)
[facility]    CSN: [id number] Arrival date & time: 07/10/22  0800      History   Chief Complaint No chief complaint on file.   HPI Theresa Becker is a 36 y.o. female.   Patient presenting today with 1 week history of left upper dental pain where a filling had fallen out.  She has tried Gentex over-the-counter with no relief.  She denies fever, chills, drainage, difficulty breathing or swallowing.  States her dental coverage does not activate until next week.    Past Medical History:  Diagnosis Date   Anemia 2017   taking Iron supplements during third trimester   Bronchitis    HIV disease (HCC)    Hypertension    on PO medication   Obesity     Patient Active Problem List   Diagnosis Date Noted   Gonorrhea 07/02/2018   Chlamydia infection 07/02/2018   Chronic hypertension with exacerbation during pregnancy in second trimester 07/03/2016   HIV (human immunodeficiency virus infection) (HCC) 04/15/2016   Obesity 04/15/2016    Past Surgical History:  Procedure Laterality Date   NO PAST SURGERIES      OB History     Gravida  2   Para  2   Term  2   Preterm  0   AB  0   Living  2      SAB  0   IAB  0   Ectopic  0   Multiple  0   Live Births  2            Home Medications    Prior to Admission medications   Medication Sig Start Date End Date Taking? Authorizing Provider  chlorhexidine (PERIDEX) 0.12 % solution Use as directed 15 mLs in the mouth or throat 2 (two) times daily. 07/10/22  Yes Particia Nearing, PA-C  clindamycin (CLEOCIN) 300 MG capsule Take 1 capsule (300 mg total) by mouth 2 (two) times daily. 07/10/22  Yes Particia Nearing, PA-C  azithromycin (ZITHROMAX) 250 MG tablet Take first 2 tablets together, then 1 every day until finished. 02/26/22   Particia Nearing, PA-C  cefdinir (OMNICEF) 300 MG capsule Take 1 capsule (300 mg total) by mouth 2 (two) times daily. 09/26/21   Arthor Captain, PA-C   emtricitabine-tenofovir (TRUVADA) 200-300 MG per tablet Take 1 tablet by mouth at bedtime.     [provider]  Etravirine 200 MG TABS Take 200 mg by mouth at bedtime.     [provider]  predniSONE (DELTASONE) 20 MG tablet Take 2 tablets (40 mg total) by mouth daily with breakfast. 02/26/22   Particia Nearing, PA-C    Family History Family History  Problem Relation Age of Onset   Healthy Mother    Healthy Father     Social History Social History   Tobacco Use   Smoking status: Former    Types: Cigarettes    Quit date: 10/10/2017    Years since quitting: 4.7   Smokeless tobacco: Never  Vaping Use   Vaping Use: Never used  Substance Use Topics   Alcohol use: No   Drug use: No     Allergies   Penicillins   Review of Systems Review of Systems Per HPI  Physical Exam Triage Vital Signs ED Triage Vitals  Enc Vitals Group     BP 07/10/22 0811 (!) 176/112     Pulse Rate 07/10/22 0811 86     Resp 07/10/22  0811 18     Temp 07/10/22 0811 98.8 F (37.1 C)     Temp Source 07/10/22 0811 Oral     SpO2 07/10/22 0811 98 %     Weight --      Height --      Head Circumference --      Peak Flow --      Pain Score 07/10/22 0813 8     Pain Loc --      Pain Edu? --      Excl. in GC? --    No data found.  Updated Vital Signs BP (!) 176/112 (BP Location: Right Arm)   Pulse 86   Temp 98.8 F (37.1 C) (Oral)   Resp 18   LMP 07/09/2022 (Exact Date)   SpO2 98%   Visual Acuity Right Eye Distance:   Left Eye Distance:   Bilateral Distance:    Right Eye Near:   Left Eye Near:    Bilateral Near:     Physical Exam Vitals and nursing note reviewed.  Constitutional:      Appearance: Normal appearance. She is not ill-appearing.  HENT:     Head: Atraumatic.     Mouth/Throat:     Mouth: Mucous membranes are moist.     Pharynx: No posterior oropharyngeal erythema.     Comments: No obvious abscess to the left upper dental region Eyes:      Extraocular Movements: Extraocular movements intact.     Conjunctiva/sclera: Conjunctivae normal.  Cardiovascular:     Rate and Rhythm: Normal rate and regular rhythm.     Heart sounds: Normal heart sounds.  Pulmonary:     Effort: Pulmonary effort is normal.     Breath sounds: Normal breath sounds.  Musculoskeletal:        General: Normal range of motion.     Cervical back: Normal range of motion and neck supple.  Lymphadenopathy:     Cervical: No cervical adenopathy.  Skin:    General: Skin is warm and dry.  Neurological:     Mental Status: She is alert and oriented to person, place, and time.     Motor: No weakness.     Gait: Gait normal.  Psychiatric:        Mood and Affect: Mood normal.        Thought Content: Thought content normal.        Judgment: Judgment normal.      UC Treatments / Results  Labs (all labs ordered are listed, but only abnormal results are displayed) Labs Reviewed - No data to display  EKG   Radiology No results found.  Procedures Procedures (including critical care time)  Medications Ordered in UC Medications - No data to display  Initial Impression / Assessment and Plan / UC Course  I have reviewed the triage vital signs and the nursing notes.  Pertinent labs & imaging results that were available during my care of the patient were reviewed by me and considered in my medical decision making (see chart for details).     Treat with clindamycin, Peridex rinse, over-the-counter pain relievers and close dental follow-up.  Return for worsening symptoms.  Final Clinical Impressions(s) / UC Diagnoses   Final diagnoses:  Pain, dental   Discharge Instructions   None    ED Prescriptions     Medication Sig Dispense Auth. Provider   clindamycin (CLEOCIN) 300 MG capsule Take 1 capsule (300 mg total) by mouth 2 (two) times daily. 14 capsule  Particia Nearing, PA-C   chlorhexidine (PERIDEX) 0.12 % solution Use as directed 15 mLs in the  mouth or throat 2 (two) times daily. 120 mL Particia Nearing, [place]      PDMP not reviewed this encounter.   Roosvelt Maser [place], [place] 07/10/22 [phone]

## 2022-07-10 NOTE — ED Triage Notes (Signed)
Tooth ache on left upper side of mouth x 1 week.  States a filling fell out about a month ago

## 2022-08-26 ENCOUNTER — Ambulatory Visit
Admission: EM | Admit: 2022-08-26 | Discharge: 2022-08-26 | Disposition: A | Discharge status: Home / Self Care | Payer: 59 | Attending: Family Medicine | Admitting: Family Medicine

## 2022-08-26 DIAGNOSIS — K0889 Other specified disorders of teeth and supporting structures: Secondary | ICD-10-CM

## 2022-08-26 MED ORDER — IBUPROFEN 800 MG PO TABS: 800.0000 mg | ORAL_TABLET | Freq: Three times a day (TID) | ORAL | 1 refills | Status: AC | PRN

## 2022-08-26 NOTE — ED Provider Notes (Signed)
[facility]    CSN: [id number] Arrival date & time: 08/26/22  1436      History   Chief Complaint No chief complaint on file.   HPI Theresa Becker is a 36 y.o. female.   Patient presenting today with ongoing dental pain, just had 2 teeth extracted recently and taking antibiotics for this but lost her prescription for 800 mg ibuprofen.  Has also missed both Monday and today due to the pain.  Requesting a work note.  Denies fever, chills, drainage, difficulty breathing or swallowing.    Past Medical History:  Diagnosis Date   Anemia 2017   taking Iron supplements during third trimester   Bronchitis    HIV disease ([place])    Hypertension    on PO medication   Obesity     Patient Active Problem List   Diagnosis Date Noted   Gonorrhea 07/02/2018   Chlamydia infection 07/02/2018   Chronic hypertension with exacerbation during pregnancy in second trimester 07/03/2016   HIV (human immunodeficiency virus infection) ([place]) 04/15/2016   Obesity 04/15/2016    Past Surgical History:  Procedure Laterality Date   NO PAST SURGERIES      OB History     Gravida  2   Para  2   Term  2   Preterm  0   AB  0   Living  2      SAB  0   IAB  0   Ectopic  0   Multiple  0   Live Births  2            Home Medications    Prior to Admission medications   Medication Sig Start Date End Date Taking? Authorizing Provider  ibuprofen (ADVIL) 800 MG tablet Take 1 tablet (800 mg total) by mouth every 8 (eight) hours as needed. 08/26/22  Yes Volney American, PA-C  azithromycin (ZITHROMAX) 250 MG tablet Take first 2 tablets together, then 1 every day until finished. 02/26/22   Volney American, PA-C  cefdinir (OMNICEF) 300 MG capsule Take 1 capsule (300 mg total) by mouth 2 (two) times daily. 09/26/21   Margarita Mail, PA-C  chlorhexidine (PERIDEX) 0.12 % solution Use as directed 15 mLs in the mouth or throat 2 (two) times daily. 07/10/22   Volney American, PA-C  clindamycin (CLEOCIN) 300 MG capsule Take 1 capsule (300 mg total) by mouth 2 (two) times daily. 07/10/22   Volney American, PA-C  emtricitabine-tenofovir (TRUVADA) 200-300 MG per tablet Take 1 tablet by mouth at bedtime.     [provider]  Etravirine 200 MG TABS Take 200 mg by mouth at bedtime.     [provider]  predniSONE (DELTASONE) 20 MG tablet Take 2 tablets (40 mg total) by mouth daily with breakfast. 02/26/22   Volney American, PA-C    Family History Family History  Problem Relation Age of Onset   Healthy Mother    Healthy Father     Social History Social History   Tobacco Use   Smoking status: Former    Types: Cigarettes    Quit date: 10/10/2017    Years since quitting: 4.8   Smokeless tobacco: Never  Vaping Use   Vaping Use: Never used  Substance Use Topics   Alcohol use: No   Drug use: No     Allergies   Penicillins   Review of Systems Review of Systems Per HPI  Physical Exam Triage Vital Signs  ED Triage Vitals  Enc Vitals Group     BP 08/26/22 1455 (!) 166/116     Pulse Rate 08/26/22 1455 84     Resp --      Temp 08/26/22 1455 98.9 F (37.2 C)     Temp Source 08/26/22 1455 Oral     SpO2 08/26/22 1455 98 %     Weight --      Height --      Head Circumference --      Peak Flow --      Pain Score 08/26/22 1459 5     Pain Loc --      Pain Edu? --      Excl. in GC? --    No data found.  Updated Vital Signs BP (!) 166/116 (BP Location: Right Arm)   Pulse 84   Temp 98.9 F (37.2 C) (Oral)   LMP  (Within Months)   SpO2 98%   Visual Acuity Right Eye Distance:   Left Eye Distance:   Bilateral Distance:    Right Eye Near:   Left Eye Near:    Bilateral Near:     Physical Exam Vitals and nursing note reviewed.  Constitutional:      Appearance: Normal appearance. She is not ill-appearing.  HENT:     Head: Atraumatic.     Mouth/Throat:     Mouth: Mucous membranes are moist.      Pharynx: Oropharynx is clear.  Eyes:     Extraocular Movements: Extraocular movements intact.     Conjunctiva/sclera: Conjunctivae normal.  Cardiovascular:     Rate and Rhythm: Normal rate and regular rhythm.     Heart sounds: Normal heart sounds.  Pulmonary:     Effort: Pulmonary effort is normal.     Breath sounds: Normal breath sounds.  Musculoskeletal:        General: Normal range of motion.     Cervical back: Normal range of motion and neck supple.  Skin:    General: Skin is warm and dry.  Neurological:     Mental Status: She is alert and oriented to person, place, and time.  Psychiatric:        Mood and Affect: Mood normal.        Thought Content: Thought content normal.        Judgment: Judgment normal.      UC Treatments / Results  Labs (all labs ordered are listed, but only abnormal results are displayed) Labs Reviewed - No data to display  EKG   Radiology No results found.  Procedures Procedures (including critical care time)  Medications Ordered in UC Medications - No data to display  Initial Impression / Assessment and Plan / UC Course  I have reviewed the triage vital signs and the nursing notes.  Pertinent labs & imaging results that were available during my care of the patient were reviewed by me and considered in my medical decision making (see chart for details).     Ibuprofen sent, work note given.  Continue antibiotics per dentist.  Return for worsening symptoms.  Final Clinical Impressions(s) / UC Diagnoses   Final diagnoses:  Pain, dental   Discharge Instructions   None    ED Prescriptions     Medication Sig Dispense Auth. Provider   ibuprofen (ADVIL) 800 MG tablet Take 1 tablet (800 mg total) by mouth every 8 (eight) hours as needed. 30 tablet Particia Nearing, [place]      PDMP not reviewed  this encounter.   Particia Nearing, [place] 08/26/22 1530

## 2022-08-26 NOTE — ED Triage Notes (Signed)
Pt report pain in tooth from getting extraction lost prescription needs ibuprofen

## 2022-11-15 ENCOUNTER — Ambulatory Visit
Admission: EM | Admit: 2022-11-15 | Discharge: 2022-11-15 | Discharge status: Against Medical Advice | Payer: No Typology Code available for payment source

## 2022-11-15 NOTE — ED Notes (Signed)
Called in lobby and redialed phone number, unable to reach patient

## 2022-11-15 NOTE — ED Notes (Signed)
Called on phone, no answer

## 2022-11-15 NOTE — ED Notes (Signed)
Patient access called a different number and no answer, initial phone number was tried again as well, no answer.

## 2022-11-19 ENCOUNTER — Ambulatory Visit
Admission: EM | Admit: 2022-11-19 | Discharge: 2022-11-19 | Disposition: A | Discharge status: Home / Self Care | Payer: No Typology Code available for payment source | Attending: Family Medicine | Admitting: Family Medicine

## 2022-11-19 DIAGNOSIS — K047 Periapical abscess without sinus: Secondary | ICD-10-CM | POA: Diagnosis not present

## 2022-11-19 MED ORDER — CHLORHEXIDINE GLUCONATE 0.12 % MT SOLN: 15.0000 mL | Freq: Two times a day (BID) | OROMUCOSAL | 0 refills | Status: DC

## 2022-11-19 MED ORDER — CLINDAMYCIN HCL 300 MG PO CAPS: 300.0000 mg | ORAL_CAPSULE | Freq: Two times a day (BID) | ORAL | 0 refills | Status: DC

## 2022-11-19 NOTE — ED Triage Notes (Signed)
Pt reports tooth pain on right side of face x 2 weeks ago. Says she feels like she has an abscess on the right side of her mouth that's causing facial swelling.

## 2022-11-19 NOTE — ED Provider Notes (Signed)
[facility]    CSN: [id number] Arrival date & time: 11/19/22  1353      History   Chief Complaint No chief complaint on file.   HPI Theresa Becker is a 37 y.o. female.   Patient presenting today with progressively worsening right upper dental pain for the past 2 weeks.  States she was supposed to have gotten this tooth pulled but missed her appointment and is working on rescheduling.  Denies fever, chills, difficulty breathing or swallowing.  Taking ibuprofen and Tylenol with no relief.    Past Medical History:  Diagnosis Date   Anemia 2017   taking Iron supplements during third trimester   Bronchitis    HIV disease ([place])    Hypertension    on PO medication   Obesity     Patient Active Problem List   Diagnosis Date Noted   Gonorrhea 07/02/2018   Chlamydia infection 07/02/2018   Chronic hypertension with exacerbation during pregnancy in second trimester 07/03/2016   HIV (human immunodeficiency virus infection) ([place]) 04/15/2016   Obesity 04/15/2016    Past Surgical History:  Procedure Laterality Date   NO PAST SURGERIES      OB History     Gravida  2   Para  2   Term  2   Preterm  0   AB  0   Living  2      SAB  0   IAB  0   Ectopic  0   Multiple  0   Live Births  2            Home Medications    Prior to Admission medications   Medication Sig Start Date End Date Taking? Authorizing Provider  chlorhexidine (PERIDEX) 0.12 % solution Use as directed 15 mLs in the mouth or throat 2 (two) times daily. 11/19/22  Yes Volney American, PA-C  azithromycin (ZITHROMAX) 250 MG tablet Take first 2 tablets together, then 1 every day until finished. 02/26/22   Volney American, PA-C  cefdinir (OMNICEF) 300 MG capsule Take 1 capsule (300 mg total) by mouth 2 (two) times daily. 09/26/21   Margarita Mail, PA-C  chlorhexidine (PERIDEX) 0.12 % solution Use as directed 15 mLs in the mouth or throat 2 (two) times daily. 07/10/22    Volney American, PA-C  clindamycin (CLEOCIN) 300 MG capsule Take 1 capsule (300 mg total) by mouth 2 (two) times daily. 11/19/22   Volney American, PA-C  emtricitabine-tenofovir (TRUVADA) 200-300 MG per tablet Take 1 tablet by mouth at bedtime.     [provider]  Etravirine 200 MG TABS Take 200 mg by mouth at bedtime.     [provider]  ibuprofen (ADVIL) 800 MG tablet Take 1 tablet (800 mg total) by mouth every 8 (eight) hours as needed. 08/26/22   Volney American, PA-C  predniSONE (DELTASONE) 20 MG tablet Take 2 tablets (40 mg total) by mouth daily with breakfast. 02/26/22   Volney American, PA-C    Family History Family History  Problem Relation Age of Onset   Healthy Mother    Healthy Father     Social History Social History   Tobacco Use   Smoking status: Former    Types: Cigarettes    Quit date: 10/10/2017    Years since quitting: 5.1   Smokeless tobacco: Never  Vaping Use   Vaping Use: Never used  Substance Use Topics   Alcohol use: No   Drug  use: No     Allergies   Penicillins   Review of Systems Review of Systems Per HPI  Physical Exam Triage Vital Signs ED Triage Vitals  Enc Vitals Group     BP 11/19/22 1535 (!) 180/116     Pulse Rate 11/19/22 1535 86     Resp 11/19/22 1535 20     Temp 11/19/22 1535 98.7 F (37.1 C)     Temp Source 11/19/22 1535 Oral     SpO2 11/19/22 1535 98 %     Weight --      Height --      Head Circumference --      Peak Flow --      Pain Score 11/19/22 1536 7     Pain Loc --      Pain Edu? --      Excl. in [place]? --    No data found.  Updated Vital Signs BP (!) 180/116 (BP Location: Right Wrist)   Pulse 86   Temp 98.7 F (37.1 C) (Oral)   Resp 20   LMP 10/30/2022 (Approximate)   SpO2 98%   Visual Acuity Right Eye Distance:   Left Eye Distance:   Bilateral Distance:    Right Eye Near:   Left Eye Near:    Bilateral Near:     Physical Exam Vitals and nursing note  reviewed.  Constitutional:      Appearance: Normal appearance. She is not ill-appearing.  HENT:     Head: Atraumatic.     Mouth/Throat:     Mouth: Mucous membranes are moist.     Pharynx: Oropharynx is clear.     Comments: Erythematous, edematous area to gingiva of the right upper jaw in the area of her pain Eyes:     Extraocular Movements: Extraocular movements intact.     Conjunctiva/sclera: Conjunctivae normal.  Cardiovascular:     Rate and Rhythm: Normal rate and regular rhythm.     Heart sounds: Normal heart sounds.  Pulmonary:     Effort: Pulmonary effort is normal.     Breath sounds: Normal breath sounds.  Musculoskeletal:        General: Normal range of motion.     Cervical back: Normal range of motion and neck supple.  Skin:    General: Skin is warm and dry.  Neurological:     Mental Status: She is alert and oriented to person, place, and time.  Psychiatric:        Mood and Affect: Mood normal.        Thought Content: Thought content normal.        Judgment: Judgment normal.    UC Treatments / Results  Labs (all labs ordered are listed, but only abnormal results are displayed) Labs Reviewed - No data to display  EKG  Radiology No results found.  Procedures Procedures (including critical care time)  Medications Ordered in UC Medications - No data to display  Initial Impression / Assessment and Plan / UC Course  I have reviewed the triage vital signs and the nursing notes.  Pertinent labs & imaging results that were available during my care of the patient were reviewed by me and considered in my medical decision making (see chart for details).     Treat with clindamycin, Peridex, over-the-counter pain relievers.  Follow-up with dentist as soon as possible.  Final Clinical Impressions(s) / UC Diagnoses   Final diagnoses:  Dental infection   Discharge Instructions   None  ED Prescriptions     Medication Sig Dispense Auth. Provider    clindamycin (CLEOCIN) 300 MG capsule Take 1 capsule (300 mg total) by mouth 2 (two) times daily. 14 capsule Particia Nearing, [place]   chlorhexidine (PERIDEX) 0.12 % solution Use as directed 15 mLs in the mouth or throat 2 (two) times daily. 120 mL Particia Nearing, [place]      PDMP not reviewed this encounter.   Particia Nearing, [place] 11/19/22 1553

## 2023-01-28 ENCOUNTER — Ambulatory Visit
Admission: EM | Admit: 2023-01-28 | Discharge: 2023-01-28 | Disposition: A | Discharge status: Home / Self Care | Payer: No Typology Code available for payment source | Attending: Family Medicine | Admitting: Family Medicine

## 2023-01-28 DIAGNOSIS — Z21 Asymptomatic human immunodeficiency virus [HIV] infection status: Secondary | ICD-10-CM | POA: Diagnosis not present

## 2023-01-28 DIAGNOSIS — Z79899 Other long term (current) drug therapy: Secondary | ICD-10-CM | POA: Diagnosis not present

## 2023-01-28 DIAGNOSIS — J069 Acute upper respiratory infection, unspecified: Secondary | ICD-10-CM | POA: Diagnosis present

## 2023-01-28 DIAGNOSIS — Z87891 Personal history of nicotine dependence: Secondary | ICD-10-CM | POA: Diagnosis not present

## 2023-01-28 DIAGNOSIS — R030 Elevated blood-pressure reading, without diagnosis of hypertension: Secondary | ICD-10-CM | POA: Insufficient documentation

## 2023-01-28 DIAGNOSIS — U071 COVID-19: Secondary | ICD-10-CM | POA: Insufficient documentation

## 2023-01-28 DIAGNOSIS — Z91148 Patient's other noncompliance with medication regimen for other reason: Secondary | ICD-10-CM | POA: Insufficient documentation

## 2023-01-28 MED ORDER — AMLODIPINE BESYLATE 5 MG PO TABS: 5.0000 mg | ORAL_TABLET | Freq: Every day | ORAL | 1 refills | Status: AC

## 2023-01-28 MED ORDER — CETIRIZINE HCL 10 MG PO TABS: 10.0000 mg | ORAL_TABLET | Freq: Every day | ORAL | 2 refills | Status: DC

## 2023-01-28 MED ORDER — PROMETHAZINE-DM 6.25-15 MG/5ML PO SYRP: 5.0000 mL | ORAL_SOLUTION | Freq: Four times a day (QID) | ORAL | 0 refills | Status: DC | PRN

## 2023-01-28 MED ORDER — FLUTICASONE PROPIONATE 50 MCG/ACT NA SUSP: 1.0000 | Freq: Two times a day (BID) | NASAL | 2 refills | Status: DC

## 2023-01-28 NOTE — ED Triage Notes (Signed)
Pt reports she has slight nose congestion and left ear fullness x 4 days. Took Robitussin which gave some relief.   Pt is in need of a pcp + BP meds

## 2023-01-28 NOTE — Discharge Instructions (Addendum)
For your upper respiratory symptoms, I have sent over a cough syrup and you may take Coricidin HBP, plain Mucinex, use sinus rinses in addition to taking daily Zyrtec and Flonase for allergy control.  I have also sent over a new blood pressure medication for you to start on.  Try to check your home blood pressures and write them down so that you can show this information to your primary care provider.  If you need to establish with a primary care provider, you may go to the [facility] health website and click on the find a provider tab.  From here, you can locate a primary care office that is accepting new patients and even schedule your first appointment online.  Follow-up here prior to running out of your medication if you are unable to get an appointment by then or if your blood pressures are still abnormal or you are having side effects with the medication.

## 2023-01-28 NOTE — ED Provider Notes (Signed)
[facility]    CSN: [id number] Arrival date & time: 01/28/23  0813      History   Chief Complaint Chief Complaint  Patient presents with   Cough    HPI Theresa Becker is a 37 y.o. female.   Presenting today with 4-day history of nasal congestion, cough, left ear pressure.  Denies fever, chills, chest pain, shortness of breath, abdominal pain, nausea vomiting or diarrhea.  No known sick contacts recently.  So far taking Flonase and Robitussin with minimal relief.  She is also concerned about her persistent elevated blood pressure readings, states she several years ago was taking lisinopril but has not had a PCP for quite some time so has been off of her medications and notes that her readings are consistently high.  She denies chest pain, shortness of breath, dizziness, headaches.    Past Medical History:  Diagnosis Date   Anemia 2017   taking Iron supplements during third trimester   Bronchitis    HIV disease ([place])    Hypertension    on PO medication   Obesity     Patient Active Problem List   Diagnosis Date Noted   Gonorrhea 07/02/2018   Chlamydia infection 07/02/2018   Chronic hypertension with exacerbation during pregnancy in second trimester 07/03/2016   HIV (human immunodeficiency virus infection) ([place]) 04/15/2016   Obesity 04/15/2016    Past Surgical History:  Procedure Laterality Date   NO PAST SURGERIES      OB History     Gravida  2   Para  2   Term  2   Preterm  0   AB  0   Living  2      SAB  0   IAB  0   Ectopic  0   Multiple  0   Live Births  2            Home Medications    Prior to Admission medications   Medication Sig Start Date End Date Taking? Authorizing Provider  amLODipine (NORVASC) 5 MG tablet Take 1 tablet (5 mg total) by mouth daily. 01/28/23  Yes Volney American, PA-C  cetirizine (ZYRTEC ALLERGY) 10 MG tablet Take 1 tablet (10 mg total) by mouth daily. 01/28/23  Yes Volney American,  PA-C  fluticasone [facility]) 50 MCG/ACT nasal spray Place 1 spray into both nostrils 2 (two) times daily. 01/28/23  Yes Volney American, PA-C  promethazine-dextromethorphan (PROMETHAZINE-DM) 6.25-15 MG/5ML syrup Take 5 mLs by mouth 4 (four) times daily as needed. 01/28/23  Yes Volney American, PA-C  azithromycin (ZITHROMAX) 250 MG tablet Take first 2 tablets together, then 1 every day until finished. 02/26/22   Volney American, PA-C  cefdinir (OMNICEF) 300 MG capsule Take 1 capsule (300 mg total) by mouth 2 (two) times daily. 09/26/21   Margarita Mail, PA-C  chlorhexidine (PERIDEX) 0.12 % solution Use as directed 15 mLs in the mouth or throat 2 (two) times daily. 07/10/22   Volney American, PA-C  chlorhexidine (PERIDEX) 0.12 % solution Use as directed 15 mLs in the mouth or throat 2 (two) times daily. 11/19/22   Volney American, PA-C  clindamycin (CLEOCIN) 300 MG capsule Take 1 capsule (300 mg total) by mouth 2 (two) times daily. 11/19/22   Volney American, PA-C  emtricitabine-tenofovir (TRUVADA) 200-300 MG per tablet Take 1 tablet by mouth at bedtime.     [provider]  Etravirine 200 MG TABS Take 200 mg  by mouth at bedtime.     [provider]  ibuprofen (ADVIL) 800 MG tablet Take 1 tablet (800 mg total) by mouth every 8 (eight) hours as needed. 08/26/22   Volney American, PA-C  predniSONE (DELTASONE) 20 MG tablet Take 2 tablets (40 mg total) by mouth daily with breakfast. 02/26/22   Volney American, PA-C    Family History Family History  Problem Relation Age of Onset   Healthy Mother    Healthy Father     Social History Social History   Tobacco Use   Smoking status: Former    Types: Cigarettes    Quit date: 10/10/2017    Years since quitting: 5.3   Smokeless tobacco: Never  Vaping Use   Vaping Use: Never used  Substance Use Topics   Alcohol use: No   Drug use: No     Allergies   Penicillins   Review of  Systems Review of Systems Per HPI  Physical Exam Triage Vital Signs ED Triage Vitals  Enc Vitals Group     BP 01/28/23 0819 (!) 176/116     Pulse Rate 01/28/23 0819 90     Resp 01/28/23 0819 20     Temp 01/28/23 0819 98.2 F (36.8 C)     Temp Source 01/28/23 0819 Oral     SpO2 01/28/23 0819 98 %     Weight --      Height --      Head Circumference --      Peak Flow --      Pain Score 01/28/23 0821 0     Pain Loc --      Pain Edu? --      Excl. in [place]? --    No data found.  Updated Vital Signs BP (!) 176/116 (BP Location: Right Arm)   Pulse 90   Temp 98.2 F (36.8 C) (Oral)   Resp 20   LMP 01/12/2023   SpO2 98%   Visual Acuity Right Eye Distance:   Left Eye Distance:   Bilateral Distance:    Right Eye Near:   Left Eye Near:    Bilateral Near:     Physical Exam Vitals and nursing note reviewed.  Constitutional:      Appearance: Normal appearance.  HENT:     Head: Atraumatic.     Right Ear: Tympanic membrane and external ear normal.     Left Ear: Tympanic membrane and external ear normal.     Nose: Congestion present.     Mouth/Throat:     Mouth: Mucous membranes are moist.     Pharynx: Posterior oropharyngeal erythema present.  Eyes:     Extraocular Movements: Extraocular movements intact.     Conjunctiva/sclera: Conjunctivae normal.  Cardiovascular:     Rate and Rhythm: Normal rate and regular rhythm.     Heart sounds: Normal heart sounds.  Pulmonary:     Effort: Pulmonary effort is normal.     Breath sounds: Normal breath sounds. No wheezing or rales.  Musculoskeletal:        General: Normal range of motion.     Cervical back: Normal range of motion and neck supple.  Skin:    General: Skin is warm and dry.  Neurological:     Mental Status: She is alert and oriented to person, place, and time.  Psychiatric:        Mood and Affect: Mood normal.        Thought Content: Thought content normal.  UC Treatments / Results  Labs (all labs  ordered are listed, but only abnormal results are displayed) Labs Reviewed  SARS CORONAVIRUS 2 (TAT 6-24 HRS)    EKG   Radiology No results found.  Procedures Procedures (including critical care time)  Medications Ordered in UC Medications - No data to display  Initial Impression / Assessment and Plan / UC Course  I have reviewed the triage vital signs and the nursing notes.  Pertinent labs & imaging results that were available during my care of the patient were reviewed by me and considered in my medical decision making (see chart for details).     COVID testing pending, suspect viral upper respiratory infection.  Treat with Phenergan DM, Flonase, Zyrtec, Coricidin HBP, plain Mucinex and supportive home care.  Regarding her blood pressure, will start amlodipine and give resources for primary care follow-up.  Discussed logging home readings, DASH diet, exercise, stress management.  Follow-up for any concerns prior to PCP appointment.  Final Clinical Impressions(s) / UC Diagnoses   Final diagnoses:  Viral URI with cough  Elevated blood pressure reading     Discharge Instructions      For your upper respiratory symptoms, I have sent over a cough syrup and you may take Coricidin HBP, plain Mucinex, use sinus rinses in addition to taking daily Zyrtec and Flonase for allergy control.  I have also sent over a new blood pressure medication for you to start on.  Try to check your home blood pressures and write them down so that you can show this information to your primary care provider.  If you need to establish with a primary care provider, you may go to the [facility] health website and click on the find a provider tab.  From here, you can locate a primary care office that is accepting new patients and even schedule your first appointment online.  Follow-up here prior to running out of your medication if you are unable to get an appointment by then or if your blood pressures are still  abnormal or you are having side effects with the medication.    ED Prescriptions     Medication Sig Dispense Auth. Provider   cetirizine (ZYRTEC ALLERGY) 10 MG tablet Take 1 tablet (10 mg total) by mouth daily. 30 tablet Volney American, PA-C   fluticasone [facility]) 50 MCG/ACT nasal spray Place 1 spray into both nostrils 2 (two) times daily. 16 g Volney American, PA-C   amLODipine (NORVASC) 5 MG tablet Take 1 tablet (5 mg total) by mouth daily. 30 tablet Volney American, [place]   promethazine-dextromethorphan (PROMETHAZINE-DM) 6.25-15 MG/5ML syrup Take 5 mLs by mouth 4 (four) times daily as needed. 100 mL Volney American, [place]      PDMP not reviewed this encounter.   Volney American, [place] 01/28/23 [phone]

## 2023-01-29 LAB — SARS CORONAVIRUS 2 (TAT 6-24 HRS): SARS Coronavirus 2: POSITIVE — AB

## 2023-03-11 ENCOUNTER — Other Ambulatory Visit: Payer: Self-pay | Admitting: Family Medicine

## 2023-03-11 NOTE — Telephone Encounter (Signed)
Requested Prescriptions  Refused Prescriptions Disp Refills   amLODipine (NORVASC) 5 MG tablet [Pharmacy Med Name: AMLODIPINE BESYLATE  TABLETS] 90 tablet     Sig: TAKE 1 TABLET(5 MG) BY MOUTH DAILY     There is no refill protocol information for this order

## 2023-03-15 ENCOUNTER — Encounter (HOSPITAL_COMMUNITY): Payer: Self-pay | Admitting: *Deleted

## 2023-03-15 ENCOUNTER — Emergency Department (HOSPITAL_COMMUNITY)
Admission: EM | Admit: 2023-03-15 | Discharge: 2023-03-15 | Disposition: A | Discharge status: Home / Self Care | Payer: No Typology Code available for payment source | Attending: Emergency Medicine | Admitting: Emergency Medicine

## 2023-03-15 ENCOUNTER — Other Ambulatory Visit: Payer: Self-pay

## 2023-03-15 DIAGNOSIS — J029 Acute pharyngitis, unspecified: Secondary | ICD-10-CM

## 2023-03-15 DIAGNOSIS — J069 Acute upper respiratory infection, unspecified: Secondary | ICD-10-CM | POA: Insufficient documentation

## 2023-03-15 LAB — GROUP A STREP BY PCR: Group A Strep by PCR: NOT DETECTED

## 2023-03-15 LAB — POC URINE PREG, ED: Preg Test, Ur: NEGATIVE

## 2023-03-15 MED ORDER — LIDOCAINE VISCOUS HCL 2 % MT SOLN: 5.0000 mL | Freq: Three times a day (TID) | OROMUCOSAL | 0 refills | Status: DC | PRN

## 2023-03-15 MED ORDER — AZITHROMYCIN 250 MG PO TABS: ORAL_TABLET | ORAL | 0 refills | Status: DC

## 2023-03-15 NOTE — Discharge Instructions (Signed)
You may gargle with warm salt water as needed.  You have also been prescribed a mouthwash to use for your throat pain.  Do not swallow.  Take the antibiotic as directed until finished.  You may continue to take your antiallergy medication as directed.  Please follow-up with your primary care provider for recheck or return emergency department for any new or worsening symptoms

## 2023-03-15 NOTE — ED Triage Notes (Addendum)
Pt c/o cough, dry mouth, sore throat, nasal congestion/drainage that started Friday. Denies fever. Pt also reports her menstrual cycle is late and could possibly be pregnant.

## 2023-03-16 NOTE — ED Provider Notes (Signed)
[place] EM[facility]GENC[facility] AT [facility] Provider Note   CSN: [id number] Arrival date & time: 03/15/23  4098     History  Chief Complaint  Patient presents with   Cough    Theresa Becker is a 37 y.o. female.   Cough Associated symptoms: sore throat   Associated symptoms: no chest pain, no chills, no fever, no headaches, no myalgias, no rash and no shortness of breath        Theresa Becker is a 37 y.o. female who presents to the Emergency Department complaining of sore throat, nasal congestion, cough.  Symptoms began 3 days ago.  She also states that she is late on her menstrual cycle and concerned that she may be pregnant.  Describes her cough as being nonproductive.  Denies any fever or chills.  Feels a soreness of her throat associated with swallowing.  Denies any difficulty speaking, shortness of breath, nausea vomiting or diarrhea.  Home Medications Prior to Admission medications   Medication Sig Start Date End Date Taking? Authorizing Provider  azithromycin (ZITHROMAX Z-PAK) 250 MG tablet Take 2 tablets by mouth on day 1, then 1 tablet daily until pack is finished 03/15/23  Yes Makyiah Lie, PA-C  magic mouthwash (lidocaine, diphenhydrAMINE, alum & mag hydroxide) suspension Swish and spit 5 mLs 3 (three) times daily as needed for mouth pain. 03/15/23  Yes Evangela Heffler, PA-C  amLODipine (NORVASC) 5 MG tablet Take 1 tablet (5 mg total) by mouth daily. 01/28/23   Particia Nearing, PA-C  cetirizine (ZYRTEC ALLERGY) 10 MG tablet Take 1 tablet (10 mg total) by mouth daily. 01/28/23   Particia Nearing, PA-C  chlorhexidine (PERIDEX) 0.12 % solution Use as directed 15 mLs in the mouth or throat 2 (two) times daily. 07/10/22   Particia Nearing, PA-C  chlorhexidine (PERIDEX) 0.12 % solution Use as directed 15 mLs in the mouth or throat 2 (two) times daily. 11/19/22   Particia Nearing, PA-C  emtricitabine-tenofovir (TRUVADA) 200-300 MG per tablet  Take 1 tablet by mouth at bedtime.     [provider]  Etravirine 200 MG TABS Take 200 mg by mouth at bedtime.     [provider]  fluticasone (FLONASE) 50 MCG/ACT nasal spray Place 1 spray into both nostrils 2 (two) times daily. 01/28/23   Particia Nearing, PA-C  ibuprofen (ADVIL) 800 MG tablet Take 1 tablet (800 mg total) by mouth every 8 (eight) hours as needed. 08/26/22   Particia Nearing, PA-C  predniSONE (DELTASONE) 20 MG tablet Take 2 tablets (40 mg total) by mouth daily with breakfast. 02/26/22   Particia Nearing, PA-C  promethazine-dextromethorphan (PROMETHAZINE-DM) 6.25-15 MG/5ML syrup Take 5 mLs by mouth 4 (four) times daily as needed. 01/28/23   Particia Nearing, PA-C      Allergies    Penicillins    Review of Systems   Review of Systems  Constitutional:  Negative for appetite change, chills and fever.  HENT:  Positive for congestion, sinus pressure, sinus pain and sore throat. Negative for trouble swallowing.   Respiratory:  Positive for cough. Negative for shortness of breath.   Cardiovascular:  Negative for chest pain.  Gastrointestinal:  Negative for abdominal pain, diarrhea, nausea and vomiting.  Genitourinary:  Negative for dysuria.  Musculoskeletal:  Negative for arthralgias and myalgias.  Skin:  Negative for rash.  Neurological:  Negative for dizziness, syncope, weakness and headaches.    Physical Exam Updated Vital Signs BP (!) 169/108 (  BP Location: Left Wrist) Comment: Simultaneous filing. User may not have seen previous data. Comment (BP Location): Simultaneous filing. User may not have seen previous data.  Pulse 85   Temp 98.8 F (37.1 C) (Oral)   Resp 18   Ht 5\' 2"  (1.575 m)   Wt 124.7 kg   LMP 02/09/2023   SpO2 100%   BMI 50.30 kg/m  Physical Exam Vitals and nursing note reviewed.  Constitutional:      General: She is not in acute distress.    Appearance: Normal appearance.  HENT:     Right Ear: Tympanic  membrane and ear canal normal.     Left Ear: Tympanic membrane and ear canal normal.     Nose: Nose normal.     Mouth/Throat:     Mouth: Mucous membranes are moist.     Pharynx: Uvula midline. Posterior oropharyngeal erythema present. No oropharyngeal exudate or uvula swelling.     Comments: Fiery red oropharynx without significant edema.  No exudates or bulging of the soft palate. Neck:     Vascular: No carotid bruit.  Cardiovascular:     Rate and Rhythm: Normal rate and regular rhythm.     Pulses: Normal pulses.  Pulmonary:     Effort: Pulmonary effort is normal. No respiratory distress.     Breath sounds: Normal breath sounds. No wheezing.  Chest:     Chest wall: No tenderness.  Abdominal:     Palpations: Abdomen is soft.     Tenderness: There is no abdominal tenderness.  Musculoskeletal:        General: Normal range of motion.     Cervical back: Normal range of motion.  Skin:    General: Skin is warm.     Capillary Refill: Capillary refill takes less than 2 seconds.     Findings: No rash.  Neurological:     General: No focal deficit present.     Mental Status: She is alert.     Sensory: No sensory deficit.     Motor: No weakness.     ED Results / Procedures / Treatments   Labs (all labs ordered are listed, but only abnormal results are displayed) Labs Reviewed  GROUP A STREP BY PCR  POC URINE PREG, ED    EKG None  Radiology No results found.  Procedures Procedures    Medications Ordered in ED Medications - No data to display  ED Course/ Medical Decision Making/ A&P                             Medical Decision Making Patient here with URI symptoms for 3 days.  Also requesting pregnancy test as she is concerned she may be pregnant.  States her menses is late.  Denies any abdominal pain nausea vomiting or diarrhea.  Differential would include limited pregnancy, viral process, strep throat, peritonsillar abscess, retropharyngeal abscess, tonsillitis,  pneumonia PE considered but felt less likely as patient denies any shortness of breath and she is PERC negative.  Amount and/or Complexity of Data Reviewed Labs: ordered.    Details: Labs interpreted by me, pregnancy test negative strep negative Discussion of management or test interpretation with external provider(s): Patient well-appearing.  Nontoxic.  Close suspect tonsillitis, will treat with antibiotic, Magic mouthwash for symptom relief, she is agreeable to Tylenol ibuprofen if needed for fever or bodyaches.  Appears appropriate for discharge home, return precautions discussed.  Risk Prescription drug management.  Final Clinical Impression(s) / ED Diagnoses Final diagnoses:  Pharyngitis, unspecified etiology  Acute upper respiratory infection    Rx / DC Orders ED Discharge Orders          Ordered    azithromycin (ZITHROMAX Z-PAK) 250 MG tablet        03/15/23 1226    magic mouthwash (lidocaine, diphenhydrAMINE, alum & mag hydroxide) suspension  3 times daily PRN        03/15/23 1226              Zenita Kister, [place], PA-C 03/16/23 1703    Gloris [place], MD 03/23/23 [phone]

## 2023-04-16 ENCOUNTER — Ambulatory Visit
Admission: EM | Admit: 2023-04-16 | Discharge: 2023-04-16 | Disposition: A | Discharge status: Home / Self Care | Payer: Self-pay | Attending: Urgent Care | Admitting: Urgent Care

## 2023-04-16 ENCOUNTER — Encounter: Payer: Self-pay | Admitting: Emergency Medicine

## 2023-04-16 DIAGNOSIS — J069 Acute upper respiratory infection, unspecified: Secondary | ICD-10-CM | POA: Insufficient documentation

## 2023-04-16 DIAGNOSIS — R481 Agnosia: Secondary | ICD-10-CM

## 2023-04-16 DIAGNOSIS — I10 Essential (primary) hypertension: Secondary | ICD-10-CM

## 2023-04-16 DIAGNOSIS — B349 Viral infection, unspecified: Secondary | ICD-10-CM

## 2023-04-16 DIAGNOSIS — B20 Human immunodeficiency virus [HIV] disease: Secondary | ICD-10-CM

## 2023-04-16 DIAGNOSIS — Z1152 Encounter for screening for COVID-19: Secondary | ICD-10-CM | POA: Insufficient documentation

## 2023-04-16 DIAGNOSIS — R430 Anosmia: Secondary | ICD-10-CM

## 2023-04-16 MED ORDER — ACETAMINOPHEN 325 MG PO TABS: 650.0000 mg | ORAL_TABLET | Freq: Four times a day (QID) | ORAL | 0 refills | Status: AC | PRN

## 2023-04-16 MED ORDER — PROMETHAZINE-DM 6.25-15 MG/5ML PO SYRP: 5.0000 mL | ORAL_SOLUTION | Freq: Three times a day (TID) | ORAL | 0 refills | Status: AC | PRN

## 2023-04-16 MED ORDER — CETIRIZINE HCL 10 MG PO TABS: 10.0000 mg | ORAL_TABLET | Freq: Every day | ORAL | 0 refills | Status: AC

## 2023-04-16 MED ORDER — IPRATROPIUM BROMIDE 0.03 % NA SOLN: 2.0000 | Freq: Two times a day (BID) | NASAL | 0 refills | Status: AC

## 2023-04-16 NOTE — Discharge Instructions (Addendum)
We will notify you of your test results as they arrive and may take between about 24 hours.  I encourage you to sign up for MyChart if you have not already done so as this can be the easiest way for [place] to communicate results to you online or through a phone app.  Generally, we only contact you if it is a positive test result.  In the meantime, if you develop worsening symptoms including fever, chest pain, shortness of breath despite our current treatment plan then please report to the emergency room as this may be a sign of worsening status from possible viral infection.  Otherwise, we will manage this as a viral syndrome. For sore throat or cough try using a honey-based tea. Use 3 teaspoons of honey with juice squeezed from half lemon. Place shaved pieces of ginger into 1/2-1 cup of water and warm over stove top. Then mix the ingredients and repeat every 4 hours as needed. Please take Tylenol 500mg -650mg  every 6 hours for aches and pains, fevers. Hydrate very well with at least 2 liters of water. Eat light meals such as soups to replenish electrolytes and soft fruits, veggies. Start an antihistamine like Zyrtec (10mg  daily) for postnasal drainage, sinus congestion.  You can take this together with Atrovent nasal spray 2-3 times a day as needed for the same kind of congestion.  Use the cough medications as needed.   For diabetes or elevated blood sugar, please make sure you are limiting and avoiding starchy, carbohydrate foods like pasta, breads, sweet breads, pastry, rice, potatoes, desserts. These foods can elevate your blood sugar. Also, limit and avoid drinks that contain a lot of sugar such as sodas, sweet teas, fruit juices.  Drinking plain water will be much more helpful, try 64 ounces of water daily.  It is okay to flavor your water naturally by cutting cucumber, lemon, mint or lime, placing it in a picture with water and drinking it over a period of 24-48 hours as long as it remains refrigerated.  For  elevated blood pressure, make sure you are monitoring salt in your diet.  Do not eat restaurant foods and limit processed foods at home. I highly recommend you prepare and cook your own foods at home.  Processed foods include things like frozen meals, pre-seasoned meats and dinners, deli meats, canned foods as these foods contain a high amount of sodium/salt.  Make sure you are paying attention to sodium labels on foods you buy at the grocery store. Buy your spices separately such as garlic powder, onion powder, cumin, cayenne, parsley flakes so that you can avoid seasonings that contain salt. However, salt-free seasonings are available and can be used, an example is Mrs. Dash and includes a lot of different mixtures that do not contain salt.  Lastly, when cooking using oils that are healthier for you is important. This includes olive oil, avocado oil, canola oil. We have discussed a lot of foods to avoid but below is a list of foods that can be very healthy to use in your diet whether it is for diabetes, cholesterol, high blood pressure, or in general healthy eating.  Salads - kale, spinach, cabbage, spring mix, arugula Fruits - avocadoes, berries (blueberries, raspberries, blackberries), apples, oranges, pomegranate, grapefruit, kiwi Vegetables - asparagus, cauliflower, broccoli, green beans, brussel sprouts, bell peppers, beets; stay away from or limit starchy vegetables like potatoes, carrots, peas Other general foods - kidney beans, egg whites, almonds, walnuts, sunflower seeds, pumpkin seeds, fat free yogurt,  almond milk, flax seeds, quinoa, oats  Meat - It is better to eat lean meats and limit your red meat including pork to once a week.  Wild caught fish, chicken breast are good options as they tend to be leaner sources of good protein. Still be mindful of the sodium labels for the meats you buy.  DO NOT EAT ANY FOODS ON THIS LIST THAT YOU ARE ALLERGIC TO. For more specific needs, I highly  recommend consulting a dietician or nutritionist but this can definitely be a good starting point.

## 2023-04-16 NOTE — ED Triage Notes (Signed)
Exposed to covid 4 days ago.  Cough, can't taste or smell and has been sneezing x 2 days.  C/o pain in upper shoulders and back.

## 2023-04-16 NOTE — ED Provider Notes (Signed)
[facility]  Note:  This document was prepared using Conservation officer, historic buildings and may include unintentional dictation errors.  MRN: [id number] DOB: [birth year]  Subjective:   Theresa Becker is a 37 y.o. female presenting for 2-day history of coughing, loss of taste, loss of smell, sneezing, body pains.  No chest pain, shortness of breath, wheezing, urinary symptoms.  Patient had a COVID exposure 4 days ago.  She has a history of HIV disease, hypertension.  No follow up with infectious disease currently. Reports that she ate fast food prior to coming into the clinic.  Otherwise reports compliance with her blood pressure medications.  No current facility-administered medications for this encounter.  Current Outpatient Medications:    amLODipine (NORVASC) 5 MG tablet, Take 1 tablet (5 mg total) by mouth daily., Disp: 30 tablet, Rfl: 1   cetirizine (ZYRTEC ALLERGY) 10 MG tablet, Take 1 tablet (10 mg total) by mouth daily., Disp: 30 tablet, Rfl: 2   ibuprofen (ADVIL) 800 MG tablet, Take 1 tablet (800 mg total) by mouth every 8 (eight) hours as needed., Disp: 30 tablet, Rfl: 1   Allergies  Allergen Reactions   Penicillins Swelling and Other (See Comments)    Reaction:  Lip swelling Has patient had a PCN reaction causing immediate rash, facial/tongue/throat swelling, SOB or lightheadedness with hypotension: Yes Has patient had a PCN reaction causing severe rash involving mucus membranes or skin necrosis: No Has patient had a PCN reaction that required hospitalization No Has patient had a PCN reaction occurring within the last 10 years: No If all of the above answers are "NO", then may proceed with Cephalosporin use.    Past Medical History:  Diagnosis Date   Anemia 2017   taking Iron supplements during third trimester   Bronchitis    HIV disease (HCC) 2021   pt reports her testing is undetectable; she was given medications on a trial study due to concern  for possibly being exposed to HIV, pt hasn't been taken anymore of the mediation since 2021   Hypertension    on PO medication   Obesity      Past Surgical History:  Procedure Laterality Date   NO PAST SURGERIES      Family History  Problem Relation Age of Onset   Healthy Mother    Healthy Father     Social History   Tobacco Use   Smoking status: Former    Types: Cigarettes    Quit date: 10/10/2017    Years since quitting: 5.5   Smokeless tobacco: Never  Vaping Use   Vaping Use: Never used  Substance Use Topics   Alcohol use: No   Drug use: No    ROS   Objective:   Vitals: BP (!) 185/107 (BP Location: Right Arm) Comment: states has not been taking BP medications  Pulse 92   Temp 99 F (37.2 C) (Oral)   Resp 18   LMP 04/15/2023 (Exact Date)   SpO2 96%   Physical Exam Constitutional:      General: She is not in acute distress.    Appearance: Normal appearance. She is well-developed. She is obese. She is not ill-appearing, toxic-appearing or diaphoretic.  HENT:     Head: Normocephalic and atraumatic.     Right Ear: Tympanic membrane, ear canal and external ear normal. No drainage or tenderness. No middle ear effusion. There is no impacted cerumen. Tympanic membrane is not erythematous or bulging.     Left  Ear: Tympanic membrane, ear canal and external ear normal. No drainage or tenderness.  No middle ear effusion. There is no impacted cerumen. Tympanic membrane is not erythematous or bulging.     Nose: Nose normal. No congestion or rhinorrhea.     Mouth/Throat:     Mouth: Mucous membranes are moist. No oral lesions.     Pharynx: No pharyngeal swelling, oropharyngeal exudate, posterior oropharyngeal erythema or uvula swelling.     Tonsils: No tonsillar exudate or tonsillar abscesses.  Eyes:     General: No scleral icterus.       Right eye: No discharge.        Left eye: No discharge.     Extraocular Movements: Extraocular movements intact.     Right eye:  Normal extraocular motion.     Left eye: Normal extraocular motion.     Conjunctiva/sclera: Conjunctivae normal.  Cardiovascular:     Rate and Rhythm: Normal rate and regular rhythm.     Heart sounds: Normal heart sounds. No murmur heard.    No friction rub. No gallop.  Pulmonary:     Effort: Pulmonary effort is normal. No respiratory distress.     Breath sounds: No stridor. No wheezing, rhonchi or rales.  Chest:     Chest wall: No tenderness.  Musculoskeletal:     Cervical back: Normal range of motion and neck supple.  Lymphadenopathy:     Cervical: No cervical adenopathy.  Skin:    General: Skin is warm and dry.  Neurological:     General: No focal deficit present.     Mental Status: She is alert and oriented to person, place, and time.  Psychiatric:        Mood and Affect: Mood normal.        Behavior: Behavior normal.     Assessment and Plan :   PDMP not reviewed this encounter.  1. Acute viral syndrome   2. Loss of perception for taste   3. Loss of perception for smell   4. Essential hypertension   5. HIV disease (HCC)    No signs of an acute encephalopathy.  Emphasized need for dietary and medical compliance with her blood pressure medications.  Deferred imaging given clear cardiopulmonary exam, hemodynamically stable vital signs.  Will manage for viral illness such as viral URI, viral syndrome, viral rhinitis, COVID-19. Recommended supportive care. Offered scripts for symptomatic relief. Testing is pending. Counseled patient on potential for adverse effects with medications prescribed/recommended today, ER and return-to-clinic precautions discussed, patient verbalized understanding.   Patient should undergo COVID anti-viral treatment should she test positive for this.   Wallis Bamberg, PA-C 04/16/23 1125

## 2023-04-17 LAB — SARS CORONAVIRUS 2 (TAT 6-24 HRS): SARS Coronavirus 2: NEGATIVE

## 2023-05-26 ENCOUNTER — Ambulatory Visit
Admission: EM | Admit: 2023-05-26 | Discharge: 2023-05-26 | Disposition: A | Discharge status: Home / Self Care | Payer: No Typology Code available for payment source | Attending: Nurse Practitioner | Admitting: Nurse Practitioner

## 2023-05-26 ENCOUNTER — Other Ambulatory Visit: Payer: Self-pay

## 2023-05-26 ENCOUNTER — Encounter: Payer: Self-pay | Admitting: Emergency Medicine

## 2023-05-26 DIAGNOSIS — Z1152 Encounter for screening for COVID-19: Secondary | ICD-10-CM | POA: Insufficient documentation

## 2023-05-26 DIAGNOSIS — R059 Cough, unspecified: Secondary | ICD-10-CM | POA: Insufficient documentation

## 2023-05-26 DIAGNOSIS — Z20822 Contact with and (suspected) exposure to covid-19: Secondary | ICD-10-CM | POA: Diagnosis not present

## 2023-05-26 NOTE — ED Triage Notes (Signed)
Pt reports was exposed to covid a week ago and reports missed work yesterday when was notified was exposed. Pt would like to be tested for covid. States cough and abd pain.

## 2023-05-26 NOTE — ED Provider Notes (Signed)
[facility]    CSN: [id number] Arrival date & time: 05/26/23  1641      History   Chief Complaint Chief Complaint  Patient presents with   Cough    HPI Theresa Becker is a 37 y.o. female.   The history is provided by the patient.   The patient presents for complaints of cough that started over the past 1 to 2 days.  She states she was exposed to COVID approximately 8 days ago, but just found out yesterday.  She denies fever, chills, headache, ear pain, sore throat, wheezing, shortness of breath, difficulty breathing, chest pain, nausea, vomiting, or diarrhea.  She also reports abdominal pain.  Last menstrual cycle was on 05/09/2023.  She has been taking Robitussin for her cough.  She states that she received the first 2 COVID vaccines.  Past Medical History:  Diagnosis Date   Anemia 2017   taking Iron supplements during third trimester   Bronchitis    HIV disease (HCC) 2021   pt reports her testing is undetectable; she was given medications on a trial study due to concern for possibly being exposed to HIV, pt hasn't been taken anymore of the mediation since 2021   Hypertension    on PO medication   Obesity     Patient Active Problem List   Diagnosis Date Noted   Gonorrhea 07/02/2018   Chlamydia infection 07/02/2018   Chronic hypertension with exacerbation during pregnancy in second trimester 07/03/2016   HIV (human immunodeficiency virus infection) (HCC) 04/15/2016   Obesity 04/15/2016    Past Surgical History:  Procedure Laterality Date   NO PAST SURGERIES      OB History     Gravida  2   Para  2   Term  2   Preterm  0   AB  0   Living  2      SAB  0   IAB  0   Ectopic  0   Multiple  0   Live Births  2            Home Medications    Prior to Admission medications   Medication Sig Start Date End Date Taking? Authorizing Provider  acetaminophen (TYLENOL) 325 MG tablet Take 2 tablets (650 mg total) by mouth every 6 (six)  hours as needed for moderate pain. 04/16/23   Wallis Bamberg, PA-C  amLODipine (NORVASC) 5 MG tablet Take 1 tablet (5 mg total) by mouth daily. 01/28/23   Particia Nearing, PA-C  cetirizine (ZYRTEC ALLERGY) 10 MG tablet Take 1 tablet (10 mg total) by mouth daily. 04/16/23   Wallis Bamberg, PA-C  ibuprofen (ADVIL) 800 MG tablet Take 1 tablet (800 mg total) by mouth every 8 (eight) hours as needed. 08/26/22   Particia Nearing, PA-C  ipratropium (ATROVENT) 0.03 % nasal spray Place 2 sprays into both nostrils 2 (two) times daily. 04/16/23   Wallis Bamberg, PA-C  promethazine-dextromethorphan (PROMETHAZINE-DM) 6.25-15 MG/5ML syrup Take 5 mLs by mouth 3 (three) times daily as needed for cough. Patient not taking: Reported on 05/26/2023 04/16/23   Wallis Bamberg, PA-C    Family History Family History  Problem Relation Age of Onset   Healthy Mother    Healthy Father     Social History Social History   Tobacco Use   Smoking status: Former    Types: Cigarettes    Quit date: 10/10/2017    Years since quitting: 5.6   Smokeless tobacco: Never  Vaping Use   Vaping Use: Never used  Substance Use Topics   Alcohol use: No   Drug use: No     Allergies   Penicillins   Review of Systems Review of Systems Per HPI  Physical Exam Triage Vital Signs ED Triage Vitals  Enc Vitals Group     BP 05/26/23 1648 (!) 145/87     Pulse Rate 05/26/23 1648 97     Resp 05/26/23 1648 20     Temp 05/26/23 1648 99.3 F (37.4 C)     Temp Source 05/26/23 1648 Oral     SpO2 05/26/23 1648 94 %     Weight --      Height --      Head Circumference --      Peak Flow --      Pain Score 05/26/23 1649 2     Pain Loc --      Pain Edu? --      Excl. in GC? --    No data found.  Updated Vital Signs BP (!) 145/87 (BP Location: Right Arm)   Pulse 97   Temp 99.3 F (37.4 C) (Oral)   Resp 20   LMP 05/09/2023 (Approximate)   SpO2 94%   Visual Acuity Right Eye Distance:   Left Eye Distance:   Bilateral  Distance:    Right Eye Near:   Left Eye Near:    Bilateral Near:     Physical Exam Vitals and nursing note reviewed.  Constitutional:      General: She is not in acute distress.    Appearance: Normal appearance.  HENT:     Head: Normocephalic.     Right Ear: Tympanic membrane, ear canal and external ear normal.     Left Ear: Tympanic membrane, ear canal and external ear normal.     Nose:     Right Turbinates: Not enlarged or swollen.     Left Turbinates: Not enlarged or swollen.     Right Sinus: No maxillary sinus tenderness or frontal sinus tenderness.     Left Sinus: No maxillary sinus tenderness or frontal sinus tenderness.     Mouth/Throat:     Lips: Pink.     Mouth: Mucous membranes are moist.     Pharynx: Oropharynx is clear. Uvula midline. No pharyngeal swelling, oropharyngeal exudate, posterior oropharyngeal erythema or uvula swelling.  Eyes:     Extraocular Movements: Extraocular movements intact.     Conjunctiva/sclera: Conjunctivae normal.     Pupils: Pupils are equal, round, and reactive to light.  Cardiovascular:     Rate and Rhythm: Normal rate and regular rhythm.     Pulses: Normal pulses.     Heart sounds: Normal heart sounds.  Pulmonary:     Effort: Pulmonary effort is normal. No respiratory distress.     Breath sounds: Normal breath sounds. No stridor. No wheezing, rhonchi or rales.  Abdominal:     General: Bowel sounds are normal.     Palpations: Abdomen is soft.     Tenderness: There is no abdominal tenderness.  Musculoskeletal:     Cervical back: Normal range of motion.  Lymphadenopathy:     Cervical: No cervical adenopathy.  Skin:    General: Skin is warm and dry.  Neurological:     General: No focal deficit present.     Mental Status: She is alert and oriented to person, place, and time.  Psychiatric:        Mood and Affect: Mood  normal.        Behavior: Behavior normal.      UC Treatments / Results  Labs (all labs ordered are listed,  but only abnormal results are displayed) Labs Reviewed  SARS CORONAVIRUS 2 (TAT 6-24 HRS)  SARS CORONAVIRUS 2 (TAT 6-24 HRS)    EKG   Radiology No results found.  Procedures Procedures (including critical care time)  Medications Ordered in UC Medications - No data to display  Initial Impression / Assessment and Plan / UC Course  I have reviewed the triage vital signs and the nursing notes.  Pertinent labs & imaging results that were available during my care of the patient were reviewed by me and considered in my medical decision making (see chart for details).  The patient is well-appearing, she is in no acute distress, vital signs are stable.  COVID test is pending.  If test result is positive, patient is a candidate to receive molnupiravir.  Supportive care recommendations were provided and discussed with the patient to include increasing fluids, over-the-counter Tylenol for pain or discomfort, allowing for plenty of rest, and continuing over-the-counter cough and cold medications for symptoms.  Patient was in agreement with this plan of care and verbalizes understanding.  All questions were answered.  Patient stable for discharge.  Work note was provided.  Final Clinical Impressions(s) / UC Diagnoses   Final diagnoses:  Exposure to COVID-19 virus  Cough, unspecified type  Encounter for screening for COVID-19     Discharge Instructions      COVID test is pending.  You will be contacted if the pending test result is positive. May take over-the-counter Tylenol as needed for pain, fever, general discomfort.  Continue Robitussin as directed. Increase fluids and allow for plenty of rest. Recommend using a humidifier in your bedroom at nighttime during sleep and sleeping elevated on pillows while cough symptoms persist. Follow-up as needed.     ED Prescriptions   None    PDMP not reviewed this encounter.   Abran Cantor, NP 05/26/23 1729

## 2023-05-26 NOTE — Discharge Instructions (Addendum)
COVID test is pending.  You will be contacted if the pending test result is positive. May take over-the-counter Tylenol as needed for pain, fever, general discomfort.  Continue Robitussin as directed. Increase fluids and allow for plenty of rest. Recommend using a humidifier in your bedroom at nighttime during sleep and sleeping elevated on pillows while cough symptoms persist. Follow-up as needed.

## 2023-05-27 LAB — SARS CORONAVIRUS 2 (TAT 6-24 HRS): SARS Coronavirus 2: NEGATIVE

## 2023-07-06 ENCOUNTER — Emergency Department (HOSPITAL_COMMUNITY)
Admission: EM | Admit: 2023-07-06 | Discharge: 2023-07-06 | Disposition: A | Discharge status: Home / Self Care | Payer: No Typology Code available for payment source | Attending: Emergency Medicine | Admitting: Emergency Medicine

## 2023-07-06 ENCOUNTER — Encounter (HOSPITAL_COMMUNITY): Payer: Self-pay | Admitting: Emergency Medicine

## 2023-07-06 DIAGNOSIS — Z1152 Encounter for screening for COVID-19: Secondary | ICD-10-CM | POA: Insufficient documentation

## 2023-07-06 DIAGNOSIS — R059 Cough, unspecified: Secondary | ICD-10-CM | POA: Diagnosis not present

## 2023-07-06 DIAGNOSIS — Z79899 Other long term (current) drug therapy: Secondary | ICD-10-CM | POA: Diagnosis not present

## 2023-07-06 DIAGNOSIS — I10 Essential (primary) hypertension: Secondary | ICD-10-CM | POA: Insufficient documentation

## 2023-07-06 DIAGNOSIS — J00 Acute nasopharyngitis [common cold]: Secondary | ICD-10-CM

## 2023-07-06 DIAGNOSIS — R0981 Nasal congestion: Secondary | ICD-10-CM | POA: Insufficient documentation

## 2023-07-06 LAB — SARS CORONAVIRUS 2 BY RT PCR: SARS Coronavirus 2 by RT PCR: NEGATIVE

## 2023-07-06 MED ORDER — FLUTICASONE PROPIONATE 50 MCG/ACT NA SUSP: 2.0000 | Freq: Every day | NASAL | 0 refills | Status: AC

## 2023-07-06 NOTE — ED Triage Notes (Signed)
Pt endorses cough and congestion for a few days. Son also sick. Pain to upper shoulders. Worse at night.

## 2023-07-06 NOTE — Discharge Instructions (Signed)
Likely due to viral illness, drink plenty of fluids and rest, he can take over-the-counter Mucinex, he also given prescription for Flonase nasal spray to help with your runny nose.  Come back to the ER for new or worsening symptoms.  Make sure you take your blood pressure medication at home and follow-up with primary care for a blood pressure recheck.  [facility] Primary Care Doctor List    Syliva Overman, MD. Specialty: [facility] Medicine Contact information: [address]  [place] [address]  [phone]   Lilyan Punt, MD. Specialty: [facility] Medicine Contact information: [address]  [place] [address]  [phone]   Avon Gully, MD Specialty: Internal Medicine Contact information: [address] [place] [address]  [phone]   Catalina Pizza, MD. Specialty: Internal Medicine Contact information: [address]  [place] [address]  [phone]    [facility] (Dr. Selena Batten) Specialty: Family Medicine Contact information: [address]  [place] [address]  [phone]   John Giovanni, MD. Specialty: [facility] Medicine Contact information: [address]  [address]  [place] [address]  [phone]   Carylon Perches, MD. Specialty: Internal Medicine Contact information: [address]  [address]  [place] [address]  [phone]    [facility]  [address] [place], [place] [address] [phone]  Services [facility] offers a variety of basic health services.  Services include but are not limited to: Blood pressure checks  Heart rate checks  Blood sugar checks  Urine analysis  Rapid strep tests  Pregnancy tests.  Health education and referrals  People needing more complex services will be directed to a physician online. Using these virtual visits, doctors can evaluate and prescribe medicine and treatments. There will be no  medication on-site, though [facility] will help patients fill their prescriptions at little to no cost.   For More information please go to: [email or web address]

## 2023-07-06 NOTE — ED Provider Notes (Signed)
[place] EMERGENCY DEPARTMENT AT [facility] Provider Note   CSN: [id number] Arrival date & time: 07/06/23  1255     History  Chief Complaint  Patient presents with   Cough   Nasal Congestion    Theresa Becker is a 37 y.o. female.  For sinus congestion and mild cough.  Going on for 6 to 7 days, no fevers, no thick, yellow or green nasal drainage.  No productive cough, no fevers, no chest pain or shortness of breath.  States the persistent postnasal drip and cough are most bothersome.   Cough      Home Medications Prior to Admission medications   Medication Sig Start Date End Date Taking? Authorizing Provider  acetaminophen (TYLENOL) 325 MG tablet Take 2 tablets (650 mg total) by mouth every 6 (six) hours as needed for moderate pain. 04/16/23   Wallis Bamberg, PA-C  amLODipine (NORVASC) 5 MG tablet Take 1 tablet (5 mg total) by mouth daily. 01/28/23   Particia Nearing, PA-C  cetirizine (ZYRTEC ALLERGY) 10 MG tablet Take 1 tablet (10 mg total) by mouth daily. 04/16/23   Wallis Bamberg, PA-C  ibuprofen (ADVIL) 800 MG tablet Take 1 tablet (800 mg total) by mouth every 8 (eight) hours as needed. 08/26/22   Particia Nearing, PA-C  ipratropium (ATROVENT) 0.03 % nasal spray Place 2 sprays into both nostrils 2 (two) times daily. 04/16/23   Wallis Bamberg, PA-C  promethazine-dextromethorphan (PROMETHAZINE-DM) 6.25-15 MG/5ML syrup Take 5 mLs by mouth 3 (three) times daily as needed for cough. Patient not taking: Reported on 05/26/2023 04/16/23   Wallis Bamberg, PA-C      Allergies    Penicillins    Review of Systems   Review of Systems  Respiratory:  Positive for cough.     Physical Exam Updated Vital Signs BP (!) 173/93   Pulse 92   Temp 99.3 F (37.4 C) (Oral)   Resp 18   Ht 5\' 2"  (1.575 m)   Wt 125 kg   SpO2 99%   BMI 50.40 kg/m  Physical Exam Vitals and nursing note reviewed.  Constitutional:      General: She is not in acute distress.    Appearance: She is  well-developed.  HENT:     Head: Normocephalic and atraumatic.     Right Ear: Tympanic membrane normal.     Left Ear: Tympanic membrane normal.     Nose: Nose normal.     Mouth/Throat:     Mouth: Mucous membranes are moist.     Pharynx: No oropharyngeal exudate or posterior oropharyngeal erythema.  Eyes:     Extraocular Movements: Extraocular movements intact.     Conjunctiva/sclera: Conjunctivae normal.     Pupils: Pupils are equal, round, and reactive to light.  Cardiovascular:     Rate and Rhythm: Normal rate and regular rhythm.     Heart sounds: No murmur heard. Pulmonary:     Effort: Pulmonary effort is normal. No respiratory distress.     Breath sounds: Normal breath sounds.  Abdominal:     Palpations: Abdomen is soft.     Tenderness: There is no abdominal tenderness.  Musculoskeletal:        General: Tenderness present. No swelling. Normal range of motion.     Cervical back: Normal range of motion and neck supple. No tenderness.  Skin:    General: Skin is warm and dry.     Capillary Refill: Capillary refill takes less than 2 seconds.  Neurological:  Mental Status: She is alert.  Psychiatric:        Mood and Affect: Mood normal.        Behavior: Behavior normal.     ED Results / Procedures / Treatments   Labs (all labs ordered are listed, but only abnormal results are displayed) Labs Reviewed  SARS CORONAVIRUS 2 BY RT PCR    EKG None  Radiology No results found.  Procedures Procedures    Medications Ordered in ED Medications - No data to display  ED Course/ Medical Decision Making/ A&P                                 Medical Decision Making Differential diagnosis: URI, COVID-19, allergic rhinitis, other ED course: Patient is here for 1 week of sinus congestion and mild nonproductive cough, vitals show hypertension but patient is not symptomatic of this, advised on close PCP follow-up, given strict return precautions.  He has amlodipine at home,  has an adequate amount has not taken the past 2 days, instructed to take it when she gets home and make sure she is stays compliant.  COVID swab was negative, no fevers or other systemic symptoms, at this time likely viral, patient given instructions on saline nasal spray, over-the-counter Flonase, follow-up and strict return precautions.  Given work note at her request.           Final Clinical Impression(s) / ED Diagnoses Final diagnoses:  None    Rx / DC Orders ED Discharge Orders     None         Josem Kaufmann 07/06/23 1511    Bethann Berkshire, MD 07/07/23 1030

## 2023-08-10 ENCOUNTER — Ambulatory Visit
Admission: EM | Admit: 2023-08-10 | Discharge: 2023-08-10 | Disposition: A | Discharge status: Home / Self Care | Payer: No Typology Code available for payment source | Attending: Nurse Practitioner | Admitting: Nurse Practitioner

## 2023-08-10 DIAGNOSIS — Z114 Encounter for screening for human immunodeficiency virus [HIV]: Secondary | ICD-10-CM | POA: Diagnosis not present

## 2023-08-10 NOTE — ED Provider Notes (Signed)
[facility]    CSN: [id number] Arrival date & time: 08/10/23  1409      History   Chief Complaint No chief complaint on file.   HPI Theresa Becker is a 37 y.o. female.   The history is provided by the patient.   Patient presents for HIV testing only.  Patient declines further testing to include cytology testing or testing for syphilis.  Patient reports 1 female partner in the past 90 days.  Denies prior history of STI or STD.    Past Medical History:  Diagnosis Date   Anemia 2017   taking Iron supplements during third trimester   Bronchitis    HIV disease (HCC) 2021   pt reports her testing is undetectable; she was given medications on a trial study due to concern for possibly being exposed to HIV, pt hasn't been taken anymore of the mediation since 2021   Hypertension    on PO medication   Obesity     Patient Active Problem List   Diagnosis Date Noted   Gonorrhea 07/02/2018   Chlamydia infection 07/02/2018   Chronic hypertension with exacerbation during pregnancy in second trimester 07/03/2016   HIV (human immunodeficiency virus infection) (HCC) 04/15/2016   Obesity 04/15/2016    Past Surgical History:  Procedure Laterality Date   NO PAST SURGERIES      OB History     Gravida  2   Para  2   Term  2   Preterm  0   AB  0   Living  2      SAB  0   IAB  0   Ectopic  0   Multiple  0   Live Births  2            Home Medications    Prior to Admission medications   Medication Sig Start Date End Date Taking? Authorizing Provider  acetaminophen (TYLENOL) 325 MG tablet Take 2 tablets (650 mg total) by mouth every 6 (six) hours as needed for moderate pain. 04/16/23   Wallis Bamberg, PA-C  amLODipine (NORVASC) 5 MG tablet Take 1 tablet (5 mg total) by mouth daily. 01/28/23   Particia Nearing, PA-C  cetirizine (ZYRTEC ALLERGY) 10 MG tablet Take 1 tablet (10 mg total) by mouth daily. 04/16/23   Wallis Bamberg, PA-C  fluticasone (FLONASE)  50 MCG/ACT nasal spray Place 2 sprays into both nostrils daily. 07/06/23   Carmel Sacramento A, PA-C  ibuprofen (ADVIL) 800 MG tablet Take 1 tablet (800 mg total) by mouth every 8 (eight) hours as needed. 08/26/22   Particia Nearing, PA-C  ipratropium (ATROVENT) 0.03 % nasal spray Place 2 sprays into both nostrils 2 (two) times daily. 04/16/23   Wallis Bamberg, PA-C  promethazine-dextromethorphan (PROMETHAZINE-DM) 6.25-15 MG/5ML syrup Take 5 mLs by mouth 3 (three) times daily as needed for cough. Patient not taking: Reported on 05/26/2023 04/16/23   Wallis Bamberg, PA-C    Family History Family History  Problem Relation Age of Onset   Healthy Mother    Healthy Father     Social History Social History   Tobacco Use   Smoking status: Former    Current packs/day: 0.00    Types: Cigarettes    Quit date: 10/10/2017    Years since quitting: 5.8   Smokeless tobacco: Never  Vaping Use   Vaping status: Never Used  Substance Use Topics   Alcohol use: No   Drug use: No  Allergies   Penicillins   Review of Systems Review of Systems Per HPI  Physical Exam Triage Vital Signs ED Triage Vitals  Encounter Vitals Group     BP 08/10/23 1502 (!) 169/107     Systolic BP Percentile --      Diastolic BP Percentile --      Pulse Rate 08/10/23 1502 92     Resp --      Temp 08/10/23 1502 99.1 F (37.3 C)     Temp Source 08/10/23 1502 Oral     SpO2 08/10/23 1502 98 %     Weight --      Height --      Head Circumference --      Peak Flow --      Pain Score 08/10/23 1504 0     Pain Loc --      Pain Education --      Exclude from Growth Chart --    No data found.  Updated Vital Signs BP (!) 169/107 (BP Location: Right Arm)   Pulse 92   Temp 99.1 F (37.3 C) (Oral)   LMP 08/02/2023 (Exact Date)   SpO2 98%   Visual Acuity Right Eye Distance:   Left Eye Distance:   Bilateral Distance:    Right Eye Near:   Left Eye Near:    Bilateral Near:     Physical Exam Vitals and  nursing note reviewed.  Constitutional:      General: She is not in acute distress.    Appearance: Normal appearance.  HENT:     Head: Normocephalic.  Musculoskeletal:     Cervical back: Normal range of motion.  Skin:    General: Skin is warm and dry.  Neurological:     General: No focal deficit present.     Mental Status: She is alert and oriented to person, place, and time.  Psychiatric:        Mood and Affect: Mood normal.        Behavior: Behavior normal.      UC Treatments / Results  Labs (all labs ordered are listed, but only abnormal results are displayed) Labs Reviewed  HIV ANTIBODY (ROUTINE TESTING W REFLEX)    EKG   Radiology No results found.  Procedures Procedures (including critical care time)  Medications Ordered in UC Medications - No data to display  Initial Impression / Assessment and Plan / UC Course  I have reviewed the triage vital signs and the nursing notes.  Pertinent labs & imaging results that were available during my care of the patient were reviewed by me and considered in my medical decision making (see chart for details).  HIV testing is pending.  Patient was advised she will be contacted if the pending test results are abnormal.  Patient also advised she will also have access to her results via MyChart.  Patient encouraged to use condoms with each sexual encounter and to notify all partners if the pending test results are abnormal..  Patient is in agreement with this plan of care and verbalizes understanding.  All questions were answered.  Patient stable for discharge.  Final Clinical Impressions(s) / UC Diagnoses   Final diagnoses:  Encounter for screening for HIV     Discharge Instructions      Test results are pending.  You will be able to access the results via MyChart.  Results should be available within the next 24 to 48 hours. Condom use with each sexual encounter. If  your test results are abnormal, please notify all  partners. Follow-up as needed.     ED Prescriptions   None    PDMP not reviewed this encounter.   Abran Cantor, NP 08/10/23 [phone]

## 2023-08-10 NOTE — ED Triage Notes (Signed)
Pt c/o needing STI testing pt is wanting HIV testing

## 2023-08-10 NOTE — Discharge Instructions (Addendum)
Test results are pending.  You will be able to access the results via MyChart.  Results should be available within the next 24 to 48 hours. Condom use with each sexual encounter. If your test results are abnormal, please notify all partners. Follow-up as needed.

## 2023-08-11 LAB — HIV 1/2 AB DIFFERENTIATION
HIV 1 Ab: REACTIVE
HIV 2 Ab: NONREACTIVE
NOTE (HIV CONF MULTIP: POSITIVE — AB

## 2023-08-11 LAB — HIV ANTIBODY (ROUTINE TESTING W REFLEX): HIV Screen 4th Generation wRfx: REACTIVE

## 2023-08-13 ENCOUNTER — Encounter: Payer: Self-pay | Admitting: Emergency Medicine

## 2023-08-14 ENCOUNTER — Telehealth: Payer: Self-pay | Admitting: Emergency Medicine

## 2023-08-14 NOTE — Telephone Encounter (Signed)
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  Patient states that in 2019, she was advised by Dr. Anner Crete with infectious disease at [facility] that her HIV test was negative which is why she has not followed up with them since 2019.  Patient states that her boyfriend is currently positive for HIV and the reason she came in for testing on August 10, 2023 was because she wanted to know if she caught HIV from him.  Patient was again advised that her HIV test in 2017 was positive for HIV 1 and that this virus does not go away but that it can be well-controlled with antiviral treatment.  Patient strongly encouraged to reach out to Dr. Anner Crete at [facility] to schedule follow-up appointment for treatment of her HIV.  All questions addressed.
